# Patient Record
Sex: Male | Born: 1970
Health system: Southern US, Community
[De-identification: ages and names within clinical notes are randomized; demographics above are authoritative.]

## PROBLEM LIST (undated history)

## (undated) DIAGNOSIS — T7840XA Allergy, unspecified, initial encounter: Secondary | ICD-10-CM

## (undated) DIAGNOSIS — K219 Gastro-esophageal reflux disease without esophagitis: Secondary | ICD-10-CM

## (undated) DIAGNOSIS — I10 Essential (primary) hypertension: Secondary | ICD-10-CM

## (undated) HISTORY — DX: Gastro-esophageal reflux disease without esophagitis: K21.9

## (undated) HISTORY — DX: Allergy, unspecified, initial encounter: T78.40XA

## (undated) HISTORY — DX: Essential (primary) hypertension: I10

---

## 1999-12-25 ENCOUNTER — Emergency Department (HOSPITAL_COMMUNITY): Admission: EM | Admit: 1999-12-25 | Discharge: 1999-12-25 | Payer: Self-pay | Admitting: Emergency Medicine

## 2003-10-31 ENCOUNTER — Encounter: Admission: RE | Admit: 2003-10-31 | Discharge: 2003-10-31 | Payer: Self-pay | Admitting: Family Medicine

## 2006-02-26 ENCOUNTER — Emergency Department (HOSPITAL_COMMUNITY): Admission: EM | Admit: 2006-02-26 | Discharge: 2006-02-26 | Payer: Self-pay | Admitting: *Deleted

## 2008-06-08 ENCOUNTER — Ambulatory Visit (HOSPITAL_COMMUNITY): Admission: RE | Admit: 2008-06-08 | Discharge: 2008-06-08 | Payer: Self-pay | Admitting: *Deleted

## 2008-06-08 ENCOUNTER — Encounter (INDEPENDENT_AMBULATORY_CARE_PROVIDER_SITE_OTHER): Payer: Self-pay | Admitting: *Deleted

## 2008-09-07 ENCOUNTER — Encounter: Admission: RE | Admit: 2008-09-07 | Discharge: 2008-09-07 | Payer: Self-pay | Admitting: *Deleted

## 2011-01-13 NOTE — Op Note (Signed)
NAMEABDULMALIK, [name] NO.:  [id number]   MEDICAL RECORD NO.:  [id number]          PATIENT TYPE:  AMB   LOCATION:  ENDO                         FACILITY:  [facility]   PHYSICIAN:  Georgiana Spinner, M.D.    DATE OF BIRTH:  08/05/1971   DATE OF PROCEDURE:  DATE OF DISCHARGE:                               OPERATIVE REPORT   PROCEDURE:  Upper endoscopy with biopsy.   INDICATIONS:  Vomiting.   ANESTHESIA:  Fentanyl 80 mcg, Versed 8 mg.   PROCEDURE:  With the patient mildly sedated in the left lateral  decubitus position, the Pentax videoscopic endoscope was inserted in the  mouth, passed under direct vision through the esophagus, which appeared  normal until we reached distal esophagus.  There appeared to be a small  fleshy polyp that seen as were changes of probable Barrett esophagus,  and these were photographed.  We entered into the stomach, fundus, body,  antrum, duodenal bulb, second portion of the duodenum; all appeared  normal.  The patient continued to retch throughout this procedure.  It  appeared he was gagging himself on the scope despite the fact that he  was reasonably sedated.  We were able to advance it through the pylorus  into the duodenal bulb and second portion of the duodenum, both of which  appeared normal.  From this point the endoscope was slowly withdrawn,  taking circumferential views of the duodenal mucosa until the endoscope  had been pulled back into the stomach, placed in retroflexion to view  the stomach from below.  The endoscope was then straightened and  withdrawn, taking circumferential views of the remaining gastric and  esophageal mucosa, at which point we biopsied the areas that we felt  were Barrett's and also biopsied the fleshy polyp and all placed in the  same bottle labeled distal esophagus rule out Barrett's and evaluate  polyp.  The endoscope was withdrawn.  The patient's vital signs, pulse  oximeter remained stable.  The patient  tolerated the procedure well  without apparent complications.   FINDINGS:  The patient had a hypersensitive gag response and retched a  fair amount through the procedure but we saw a good view of the stomach,  duodenum, which appeared normal and also the esophagus, which showed  changes consistent with Barrett esophagus and a small fleshy polypoid  lesion, all of which were biopsied.   PLAN:  The patient would probably benefit by PPI therapy at this point  and will await biopsy report.  The patient will call me for results and  follow up with me as an outpatient.           ______________________________  Georgiana Spinner, M.D.     GMO/MEDQ  D:  06/08/2008  T:  06/09/2008  Job:  [id number]   cc:   C. Duane Lope, M.D.  Fax: [phone]

## 2011-12-04 ENCOUNTER — Ambulatory Visit: Payer: Self-pay | Admitting: Internal Medicine

## 2011-12-11 ENCOUNTER — Encounter: Payer: Self-pay | Admitting: Internal Medicine

## 2011-12-11 ENCOUNTER — Ambulatory Visit (INDEPENDENT_AMBULATORY_CARE_PROVIDER_SITE_OTHER): Payer: BC Managed Care – PPO | Admitting: Internal Medicine

## 2011-12-11 VITALS — BP 140/94 | HR 96 | Temp 98.6°F | Ht 66.75 in | Wt 172.0 lb

## 2011-12-11 DIAGNOSIS — K219 Gastro-esophageal reflux disease without esophagitis: Secondary | ICD-10-CM

## 2011-12-11 DIAGNOSIS — N433 Hydrocele, unspecified: Secondary | ICD-10-CM

## 2011-12-11 DIAGNOSIS — I10 Essential (primary) hypertension: Secondary | ICD-10-CM | POA: Insufficient documentation

## 2011-12-11 MED ORDER — LOSARTAN POTASSIUM 50 MG PO TABS: 50.0000 mg | ORAL_TABLET | Freq: Every day | ORAL | Status: DC

## 2011-12-11 MED ORDER — OMEPRAZOLE 40 MG PO CPDR: 40.0000 mg | DELAYED_RELEASE_CAPSULE | Freq: Every day | ORAL | Status: DC

## 2011-12-11 NOTE — Patient Instructions (Signed)
Please complete your blood test within 2 weeks

## 2011-12-11 NOTE — Assessment & Plan Note (Signed)
41 year old [place] American male with stage I hypertension. Start losartan 50 mg once daily. Dietary changes also recommended.

## 2011-12-11 NOTE — Assessment & Plan Note (Signed)
Enlarging right testicular mass presumed hydrocele. Repeat testicular ultrasound.

## 2011-12-11 NOTE — Progress Notes (Signed)
Subjective:    Patient ID: Marc Hancock, male    DOB: May 01, 1971, 41 y.o.   MRN: [id number]  HPI  A 41 year old African American male to establish. Patient has a history of mild hypertension x4 years. He has not been treated with any medication. His wife suggested taking magnesium, calcium and vitamin D supplement. His diet is fairly unhealthy.  Wife reports intake of processed foods. His home blood pressure readings are usually in the 140s systolic and 90-100 diastolic. He has no associated symptoms of high blood pressure. He denies history of coronary disease or history of stroke.  He also has a history of chronic GERD. He was seen by gastroenterologist approximately 3 years ago. EGD revealed moderate to severe esophagitis. He has been taking a proton pump inhibitor ever since. He denies dysphagia. He is not aware that H. pylori testing was completed.  He also has a history of right testicular abnormality. Scrotal ultrasound was performed in 2005. It showed small right hydrocele. Patient reports his right testicle intermittently gets larger. His hyrocele has definitely increased in size since previous ultrasound.   Review of Systems   Constitutional: Negative for activity change, appetite change and unexpected weight change.  Eyes: Negative for visual disturbance.  Respiratory: Negative for cough, chest tightness and shortness of breath.   Cardiovascular: Negative for chest pain.  Genitourinary: Negative for difficulty urinating.  Neurological: Negative for headaches.  Gastrointestinal: Negative for abdominal pain, heartburn melena or hematochezia Psych: Negative for depression or anxiety  Past Medical History  Diagnosis Date  . GERD (gastroesophageal reflux disease)   . Hypertension   . Allergy     History   Social History  . Marital Status: Divorced    Spouse Name: N/A    Number of Children: N/A  . Years of Education: N/A   Occupational History  . Not on file.   Social  History Main Topics  . Smoking status: Never Smoker   . Smokeless tobacco: Not on file  . Alcohol Use: Yes  . Drug Use: No  . Sexually Active: Not on file   Other Topics Concern  . Not on file   Social History Narrative  . No narrative on file    History reviewed. No pertinent past surgical history.  Family History  Problem Relation Age of Onset  . Alcohol abuse    . Arthritis    . Heart disease    . Stroke    . Depression    . Diabetes      No Known Allergies  Current Outpatient Prescriptions on File Prior to Visit  Medication Sig Dispense Refill  . calcium-vitamin D (OSCAL WITH D) 250-125 MG-UNIT per tablet Take 1 tablet by mouth daily.      Marland Kitchen losartan (COZAAR) 50 MG tablet Take 1 tablet (50 mg total) by mouth daily.  30 tablet  2  . omeprazole (PRILOSEC) 40 MG capsule Take 1 capsule (40 mg total) by mouth daily.  90 capsule  1    BP 140/94  Pulse 96  Temp(Src) 98.6 F (37 C) (Oral)  Ht 5' 6.75" (1.695 m)  Wt 172 lb (78.019 kg)  BMI 27.14 kg/m2        Objective:   Physical Exam  Constitutional: He is oriented to person, place, and time. He appears well-developed and well-nourished. No distress.  HENT:  Head: Normocephalic and atraumatic.  Right Ear: External ear normal.  Left Ear: External ear normal.  Mouth/Throat: Oropharynx is clear and moist.  Eyes: Conjunctivae are normal. Pupils are equal, round, and reactive to light. No scleral icterus.  Neck: Normal range of motion. Neck supple.  Cardiovascular: Normal rate, regular rhythm, normal heart sounds and intact distal pulses.   No murmur heard. Pulmonary/Chest: Effort normal and breath sounds normal. No respiratory distress. He has no wheezes. He has no rales.  Abdominal: Soft. Bowel sounds are normal. There is no tenderness. There is no rebound.  Genitourinary: Rectum normal, prostate normal and penis normal. Guaiac negative stool.       Firm right testicular mass, nontender  Musculoskeletal: He  exhibits no edema.  Lymphadenopathy:    He has cervical adenopathy.  Neurological: He is alert and oriented to person, place, and time. No cranial nerve deficit.  Skin: Skin is warm and dry.  Psychiatric: He has a normal mood and affect. His behavior is normal.       Assessment & Plan:

## 2011-12-11 NOTE — Assessment & Plan Note (Signed)
Patient has chronic GERD. Continue omeprazole 40 mg once daily for now. Obtain copy of previous EGD.

## 2011-12-16 ENCOUNTER — Other Ambulatory Visit: Payer: BC Managed Care – PPO

## 2012-06-11 ENCOUNTER — Other Ambulatory Visit: Payer: Self-pay | Admitting: Internal Medicine

## 2012-09-20 ENCOUNTER — Other Ambulatory Visit: Payer: Self-pay | Admitting: Internal Medicine

## 2012-10-14 ENCOUNTER — Ambulatory Visit: Payer: BC Managed Care – PPO | Admitting: Internal Medicine

## 2012-10-24 ENCOUNTER — Ambulatory Visit (INDEPENDENT_AMBULATORY_CARE_PROVIDER_SITE_OTHER): Payer: No Typology Code available for payment source | Admitting: Internal Medicine

## 2012-10-24 ENCOUNTER — Encounter: Payer: Self-pay | Admitting: Internal Medicine

## 2012-10-24 VITALS — BP 160/104 | Temp 97.9°F | Wt 169.0 lb

## 2012-10-24 DIAGNOSIS — N433 Hydrocele, unspecified: Secondary | ICD-10-CM

## 2012-10-24 DIAGNOSIS — I10 Essential (primary) hypertension: Secondary | ICD-10-CM

## 2012-10-24 DIAGNOSIS — K219 Gastro-esophageal reflux disease without esophagitis: Secondary | ICD-10-CM

## 2012-10-24 MED ORDER — LOSARTAN POTASSIUM 100 MG PO TABS: 100.0000 mg | ORAL_TABLET | Freq: Every day | ORAL | Status: DC

## 2012-10-24 MED ORDER — AMLODIPINE BESYLATE 2.5 MG PO TABS: 2.5000 mg | ORAL_TABLET | Freq: Every day | ORAL | Status: DC

## 2012-10-24 MED ORDER — DEXLANSOPRAZOLE 60 MG PO CPDR: 60.0000 mg | DELAYED_RELEASE_CAPSULE | Freq: Every day | ORAL | Status: DC

## 2012-10-24 NOTE — Assessment & Plan Note (Signed)
Blood pressure is poorly controlled despite taking 50 mg of losartan. Increase to 100 mg of losartan and amlodipine 2.5 mg once daily. Arrange BMET before next OV.

## 2012-10-24 NOTE — Assessment & Plan Note (Signed)
Patient having refractory reflux symptoms despite taking omeprazole 40 mg once daily. Switched to Dexilant 60 mg once daily

## 2012-10-24 NOTE — Progress Notes (Signed)
  Subjective:    Patient ID: Marc Hancock, male    DOB: 1971/02/23, 42 y.o.   MRN: [id number]  HPI  42 year old African American male for followup regarding hypertension and gastroesophageal reflux disease.  Despite taking omeprazole 40 mg once daily patient reports persistent reflux symptoms. He recalls he was on a "stronger medication" in the past. (Possibly Dexilant)  Hypertension-patient reports taking losartan 50 mg once daily. His blood pressure is uncontrolled.  History of right testicular abnormality-patient never completed followup scrotal ultrasound.  Review of Systems He reports bout of nausea and vomiting for 2 days on Fri and Sat.  GI symptoms resolved.  Past Medical History  Diagnosis Date  . GERD (gastroesophageal reflux disease)   . Hypertension   . Allergy     History   Social History  . Marital Status: Divorced    Spouse Name: N/A    Number of Children: N/A  . Years of Education: N/A   Occupational History  . Not on file.   Social History Main Topics  . Smoking status: Never Smoker   . Smokeless tobacco: Not on file  . Alcohol Use: Yes  . Drug Use: No  . Sexually Active: Not on file   Other Topics Concern  . Not on file   Social History Narrative  . No narrative on file    No past surgical history on file.  Family History  Problem Relation Age of Onset  . Alcohol abuse    . Arthritis    . Heart disease    . Stroke    . Depression    . Diabetes      No Known Allergies  Current Outpatient Prescriptions on File Prior to Visit  Medication Sig Dispense Refill  . aspirin 81 MG tablet Take 81 mg by mouth daily.      . calcium-vitamin D (OSCAL WITH D) 250-125 MG-UNIT per tablet Take 1 tablet by mouth daily.       No current facility-administered medications on file prior to visit.    BP 160/104  Temp(Src) 97.9 F (36.6 C) (Oral)  Wt 169 lb (76.658 kg)  BMI 26.68 kg/m2       Objective:   Physical Exam  Constitutional: He is  oriented to person, place, and time. He appears well-developed and well-nourished.  Cardiovascular: Normal rate, regular rhythm and normal heart sounds.   Pulmonary/Chest: Effort normal and breath sounds normal. He has no wheezes.  Abdominal: Soft. There is no tenderness.  Neurological: He is alert and oriented to person, place, and time.  Psychiatric: He has a normal mood and affect. His behavior is normal.          Assessment & Plan:

## 2012-10-24 NOTE — Assessment & Plan Note (Signed)
Patient never completed testicular ultrasound as directed at previous visit.. Proceed with testicular ultrasound

## 2012-10-24 NOTE — Patient Instructions (Signed)
Please complete the following lab tests before your next follow up appointment: BMET, FLP, LFTs, TSH, CBCD - 401.9

## 2012-10-28 ENCOUNTER — Ambulatory Visit
Admission: RE | Admit: 2012-10-28 | Discharge: 2012-10-28 | Disposition: A | Discharge status: Home / Self Care | Payer: No Typology Code available for payment source | Source: Ambulatory Visit | Attending: Internal Medicine | Admitting: Internal Medicine

## 2012-10-28 DIAGNOSIS — N433 Hydrocele, unspecified: Secondary | ICD-10-CM

## 2012-11-17 ENCOUNTER — Other Ambulatory Visit: Payer: No Typology Code available for payment source

## 2012-11-21 ENCOUNTER — Other Ambulatory Visit (INDEPENDENT_AMBULATORY_CARE_PROVIDER_SITE_OTHER): Payer: No Typology Code available for payment source

## 2012-11-21 ENCOUNTER — Ambulatory Visit: Payer: No Typology Code available for payment source | Admitting: Internal Medicine

## 2012-11-21 DIAGNOSIS — I10 Essential (primary) hypertension: Secondary | ICD-10-CM

## 2012-11-21 DIAGNOSIS — K219 Gastro-esophageal reflux disease without esophagitis: Secondary | ICD-10-CM

## 2012-11-22 ENCOUNTER — Ambulatory Visit (INDEPENDENT_AMBULATORY_CARE_PROVIDER_SITE_OTHER): Payer: No Typology Code available for payment source | Admitting: Internal Medicine

## 2012-11-22 ENCOUNTER — Encounter: Payer: Self-pay | Admitting: Internal Medicine

## 2012-11-22 VITALS — BP 158/110 | HR 64 | Temp 98.5°F | Wt 171.0 lb

## 2012-11-22 DIAGNOSIS — N433 Hydrocele, unspecified: Secondary | ICD-10-CM

## 2012-11-22 DIAGNOSIS — I10 Essential (primary) hypertension: Secondary | ICD-10-CM

## 2012-11-22 DIAGNOSIS — M549 Dorsalgia, unspecified: Secondary | ICD-10-CM

## 2012-11-22 LAB — BASIC METABOLIC PANEL
BUN: 13 mg/dL (ref 6–23)
CO2: 28 mEq/L (ref 19–32)
Calcium: 9.5 mg/dL (ref 8.4–10.5)
Chloride: 104 mEq/L (ref 96–112)
Creatinine, Ser: 1.1 mg/dL (ref 0.4–1.5)
GFR: 91.81 mL/min (ref >60.00)
Glucose, Bld: 92 mg/dL (ref 70–99)
Potassium: 4.1 mEq/L (ref 3.5–5.1)
Sodium: 139 mEq/L (ref 135–145)

## 2012-11-22 LAB — CBC WITH DIFFERENTIAL/PLATELET
Basophils Absolute: 0.1 10*3/uL (ref 0.0–0.1)
Basophils Relative: 0.7 % (ref 0.0–3.0)
Eosinophils Absolute: 0.1 10*3/uL (ref 0.0–0.7)
Eosinophils Relative: 0.7 % (ref 0.0–5.0)
HCT: 39.1 % (ref 39.0–52.0)
Hemoglobin: 12.8 g/dL — ABNORMAL LOW (ref 13.0–17.0)
Lymphocytes Relative: 39 % (ref 12.0–46.0)
Lymphs Abs: 3.5 10*3/uL (ref 0.7–4.0)
MCHC: 32.8 g/dL (ref 30.0–36.0)
MCV: 90.9 fl (ref 78.0–100.0)
Monocytes Absolute: 0.6 10*3/uL (ref 0.1–1.0)
Monocytes Relative: 6.8 % (ref 3.0–12.0)
Neutro Abs: 4.7 10*3/uL (ref 1.4–7.7)
Neutrophils Relative %: 52.8 % (ref 43.0–77.0)
Platelets: 186 10*3/uL (ref 150.0–400.0)
RBC: 4.31 Mil/uL (ref 4.22–5.81)
RDW: 13.3 % (ref 11.5–14.6)
WBC: 8.9 10*3/uL (ref 4.5–10.5)

## 2012-11-22 LAB — HEPATIC FUNCTION PANEL
ALT: 22 U/L (ref 0–53)
AST: 24 U/L (ref 0–37)
Albumin: 4.2 g/dL (ref 3.5–5.2)
Alkaline Phosphatase: 84 U/L (ref 39–117)
Bilirubin, Direct: 0.2 mg/dL (ref 0.0–0.3)
Total Bilirubin: 1.3 mg/dL — ABNORMAL HIGH (ref 0.3–1.2)
Total Protein: 7.5 g/dL (ref 6.0–8.3)

## 2012-11-22 LAB — TSH: TSH: 0.48 u[IU]/mL (ref 0.35–5.50)

## 2012-11-22 LAB — LIPID PANEL
Cholesterol: 173 mg/dL (ref 0–200)
HDL: 45.7 mg/dL (ref >39.00)
LDL Cholesterol: 114 mg/dL — ABNORMAL HIGH (ref 0–99)
Total CHOL/HDL Ratio: 4
Triglycerides: 67 mg/dL (ref 0.0–149.0)
VLDL: 13.4 mg/dL (ref 0.0–40.0)

## 2012-11-22 MED ORDER — AMLODIPINE BESYLATE 5 MG PO TABS: 2.5000 mg | ORAL_TABLET | Freq: Every day | ORAL | Status: DC

## 2012-11-22 MED ORDER — LOSARTAN POTASSIUM 100 MG PO TABS: 100.0000 mg | ORAL_TABLET | Freq: Every day | ORAL | Status: DC

## 2012-11-22 MED ORDER — AMLODIPINE BESYLATE 5 MG PO TABS: 5.0000 mg | ORAL_TABLET | Freq: Every day | ORAL | Status: DC

## 2012-11-22 NOTE — Assessment & Plan Note (Signed)
10/14/2012 - Testicular ultrasound showed: 1. Large simple appearing right hydrocele. Normal right testicle.  2. Tiny 2 mm hyperechoic area in the left testis, favor dystrophic calcification.  Patient reports right hydrocele is bothersome. Refer to urology for surgery.

## 2012-11-22 NOTE — Progress Notes (Signed)
  Subjective:    Patient ID: Marc Hancock, male    DOB: [birth year], 42 y.o.   MRN: [id number]  HPI  42 year old African American male previously seen for hypertension, reflux and right testicular abnormality for followup. Patient completed scrotal ultrasound. It showed right hydrocele. Patient reports right hydrocele gets larger at times and is bothersome. He is interested in surgical removal.  At previous visit patient started on amlodipine in addition to losartan. Patient's blood pressure still uncontrolled.  Patient also complains of left-sided back pain. Symptoms worse in mid thoracic spine. No radiation of pain. He denies any upper extremity weakness.   Review of Systems Negative for chest pain, no arm or leg weakness  Past Medical History  Diagnosis Date  . GERD (gastroesophageal reflux disease)   . Hypertension   . Allergy     History   Social History  . Marital Status: Divorced    Spouse Name: N/A    Number of Children: N/A  . Years of Education: N/A   Occupational History  . Not on file.   Social History Main Topics  . Smoking status: Never Smoker   . Smokeless tobacco: Not on file  . Alcohol Use: Yes  . Drug Use: No  . Sexually Active: Not on file   Other Topics Concern  . Not on file   Social History Narrative  . No narrative on file    No past surgical history on file.  Family History  Problem Relation Age of Onset  . Alcohol abuse    . Arthritis    . Heart disease    . Stroke    . Depression    . Diabetes      No Known Allergies  Current Outpatient Prescriptions on File Prior to Visit  Medication Sig Dispense Refill  . aspirin 81 MG tablet Take 81 mg by mouth daily.       No current facility-administered medications on file prior to visit.    BP 158/110  Pulse 64  Temp(Src) 98.5 F (36.9 C) (Oral)  Wt 171 lb (77.565 kg)  BMI 27 kg/m2       Objective:   Physical Exam  Constitutional: He appears well-developed and  well-nourished.  HENT:  Head: Normocephalic and atraumatic.  Cardiovascular: Normal rate, regular rhythm and normal heart sounds.   Pulmonary/Chest: Effort normal and breath sounds normal. He has no wheezes.  Musculoskeletal: He exhibits no edema.  T7-9 rotated left C3-5 rotated left  Neurological: He is alert. He displays normal reflexes. No cranial nerve deficit. He exhibits normal muscle tone.  Muscle strength 5 out of 5 upper and lower extremities  Skin: Skin is warm and dry.  Psychiatric: He has a normal mood and affect. His behavior is normal.          Assessment & Plan:

## 2012-11-22 NOTE — Assessment & Plan Note (Signed)
Blood pressure is still suboptimally controlled. Increase amlodipine to 5 mg. Continue losartan 100 mg. Encouraged low salt diet.  BP: 158/110 mmHg  Lab Results  Component Value Date   CREATININE 1.1 11/21/2012

## 2012-11-22 NOTE — Assessment & Plan Note (Signed)
Patient complains of intermittent back pain especially of his thoracic spine. Utilizing myofascial techniques and HVLA to thoracic and cervical spine. Patient tolerated well without complications. Also provided contact information for  massage therapy.

## 2012-11-22 NOTE — Patient Instructions (Addendum)
Our office will contact you re: Urology referral Please follow low salt diet (2.5 grams per day) [email or web address]

## 2012-11-25 ENCOUNTER — Ambulatory Visit: Payer: Self-pay | Admitting: Internal Medicine

## 2012-12-17 ENCOUNTER — Other Ambulatory Visit: Payer: Self-pay | Admitting: Internal Medicine

## 2012-12-20 ENCOUNTER — Other Ambulatory Visit: Payer: Self-pay | Admitting: Internal Medicine

## 2012-12-27 ENCOUNTER — Ambulatory Visit (INDEPENDENT_AMBULATORY_CARE_PROVIDER_SITE_OTHER): Payer: Self-pay | Admitting: General Surgery

## 2013-01-12 ENCOUNTER — Encounter (INDEPENDENT_AMBULATORY_CARE_PROVIDER_SITE_OTHER): Payer: Self-pay | Admitting: General Surgery

## 2013-01-24 ENCOUNTER — Other Ambulatory Visit: Payer: Self-pay | Admitting: Internal Medicine

## 2013-03-01 ENCOUNTER — Encounter (INDEPENDENT_AMBULATORY_CARE_PROVIDER_SITE_OTHER): Payer: Self-pay | Admitting: General Surgery

## 2013-03-01 ENCOUNTER — Ambulatory Visit (INDEPENDENT_AMBULATORY_CARE_PROVIDER_SITE_OTHER): Payer: No Typology Code available for payment source | Admitting: General Surgery

## 2013-03-01 VITALS — BP 148/90 | HR 88 | Resp 16 | Ht 66.5 in | Wt 170.2 lb

## 2013-03-01 DIAGNOSIS — N433 Hydrocele, unspecified: Secondary | ICD-10-CM

## 2013-03-01 DIAGNOSIS — K4090 Unilateral inguinal hernia, without obstruction or gangrene, not specified as recurrent: Secondary | ICD-10-CM

## 2013-03-01 NOTE — Patient Instructions (Signed)
You have a right inguinal hernia, and that is probably causing your right groin pain. You also have a hydrocele and have requested a vasectomy.  Your back pain and your right leg pain are not due to your hernia. You may need to see an orthopedic surgeon to discuss that.  You will be scheduled for repair of a right inguinal hernia with mesh. We will coordinate this with Dr. Brunilda Payor so that Dr. Brunilda Payor can perform a vasectomy and a hydrocelectomy at the same time.      Inguinal Hernia, Adult Muscles help keep everything in the body in its proper place. But if a weak spot in the muscles develops, something can poke through. That is called a hernia. When this happens in the lower part of the belly (abdomen), it is called an inguinal hernia. (It takes its name from a part of the body in this region called the inguinal canal.) A weak spot in the wall of muscles lets some fat or part of the small intestine bulge through. An inguinal hernia can develop at any age. Men get them more often than women. CAUSES  In adults, an inguinal hernia develops over time.  It can be triggered by:  Suddenly straining the muscles of the lower abdomen.  Lifting heavy objects.  Straining to have a bowel movement. Difficult bowel movements (constipation) can lead to this.  Constant coughing. This may be caused by smoking or lung disease.  Being overweight.  Being pregnant.  Working at a job that requires long periods of standing or heavy lifting.  Having had an inguinal hernia before. One type can be an emergency situation. It is called a strangulated inguinal hernia. It develops if part of the small intestine slips through the weak spot and cannot get back into the abdomen. The blood supply can be cut off. If that happens, part of the intestine may die. This situation requires emergency surgery. SYMPTOMS  Often, a small inguinal hernia has no symptoms. It is found when a healthcare provider does a physical exam.  Larger hernias usually have symptoms.   In adults, symptoms may include:  A lump in the groin. This is easier to see when the person is standing. It might disappear when lying down.  In men, a lump in the scrotum.  Pain or burning in the groin. This occurs especially when lifting, straining or coughing.  A dull ache or feeling of pressure in the groin.  Signs of a strangulated hernia can include:  A bulge in the groin that becomes very painful and tender to the touch.  A bulge that turns red or purple.  Fever, nausea and vomiting.  Inability to have a bowel movement or to pass gas. DIAGNOSIS  To decide if you have an inguinal hernia, a healthcare provider will probably do a physical examination.  This will include asking questions about any symptoms you have noticed.  The healthcare provider might feel the groin area and ask you to cough. If an inguinal hernia is felt, the healthcare provider may try to slide it back into the abdomen.  Usually no other tests are needed. TREATMENT  Treatments can vary. The size of the hernia makes a difference. Options include:  Watchful waiting. This is often suggested if the hernia is small and you have had no symptoms.  No medical procedure will be done unless symptoms develop.  You will need to watch closely for symptoms. If any occur, contact your healthcare provider right away.  Surgery.  This is used if the hernia is larger or you have symptoms.  Open surgery. This is usually an outpatient procedure (you will not stay overnight in a hospital). An cut (incision) is made through the skin in the groin. The hernia is put back inside the abdomen. The weak area in the muscles is then repaired by herniorrhaphy or hernioplasty. Herniorrhaphy: in this type of surgery, the weak muscles are sewn back together. Hernioplasty: a patch or mesh is used to close the weak area in the abdominal wall.  Laparoscopy. In this procedure, a surgeon makes small  incisions. A thin tube with a tiny video camera (called a laparoscope) is put into the abdomen. The surgeon repairs the hernia with mesh by looking with the video camera and using two long instruments. HOME CARE INSTRUCTIONS   After surgery to repair an inguinal hernia:  You will need to take pain medicine prescribed by your healthcare provider. Follow all directions carefully.  You will need to take care of the wound from the incision.  Your activity will be restricted for awhile. This will probably include no heavy lifting for several weeks. You also should not do anything too active for a few weeks. When you can return to work will depend on the type of job that you have.  During "watchful waiting" periods, you should:  Maintain a healthy weight.  Eat a diet high in fiber (fruits, vegetables and whole grains).  Drink plenty of fluids to avoid constipation. This means drinking enough water and other liquids to keep your urine clear or pale yellow.  Do not lift heavy objects.  Do not stand for long periods of time.  Quit smoking. This should keep you from developing a frequent cough. SEEK MEDICAL CARE IF:   A bulge develops in your groin area.  You feel pain, a burning sensation or pressure in the groin. This might be worse if you are lifting or straining.  You develop a fever of more than 100.5 F (38.1 C). SEEK IMMEDIATE MEDICAL CARE IF:   Pain in the groin increases suddenly.  A bulge in the groin gets bigger suddenly and does not go down.  For men, there is sudden pain in the scrotum. Or, the size of the scrotum increases.  A bulge in the groin area becomes red or purple and is painful to touch.  You have nausea or vomiting that does not go away.  You feel your heart beating much faster than normal.  You cannot have a bowel movement or pass gas.  You develop a fever of more than 102.0 F (38.9 C). Document Released: 01/03/2009 Document Revised: 11/09/2011  Document Reviewed: 01/03/2009 [facility] Patient Information 2014 [place], [place].

## 2013-03-01 NOTE — Progress Notes (Signed)
Patient ID: Marc Hancock, male   DOB: February 12, 1971, 42 y.o.   MRN: [id number]  No chief complaint on file.   HPI Marc Hancock is a 42 y.o. male.  He was referred by Dr. Su Grand for evaluation of a right inguinal hernia. His primary care physician is Dr. Artist Pais.  The patient states that he's had a swelling in his right groin and scrotum for about 5 years. He says he gets larger and smaller but never goes away. Lately he's been having some pain. He also reports some back pain and right leg pain, he does a lot of heavy lifting at work. An ultrasound was done which shows a large hydrocele on the right side.  He saw Dr. Brunilda Payor.  He requested a vasectomy and management of the swelling. Dr. Brunilda Payor would like to perform a vasectomy and repair his hydrocele but wanted me to evaluate the patient for a hernia.  The patient is generally healthy. He has hypertension and GERD. No prior hernia problems or surgery. HPI  Past Medical History  Diagnosis Date  . GERD (gastroesophageal reflux disease)   . Hypertension   . Allergy     History reviewed. No pertinent past surgical history.  Family History  Problem Relation Age of Onset  . Alcohol abuse    . Arthritis    . Heart disease    . Stroke    . Depression    . Diabetes      Social History History  Substance Use Topics  . Smoking status: Never Smoker   . Smokeless tobacco: Not on file  . Alcohol Use: Yes    No Known Allergies  Current Outpatient Prescriptions  Medication Sig Dispense Refill  . amLODipine (NORVASC) 5 MG tablet Take 1 tablet (5 mg total) by mouth daily.  90 tablet  1  . aspirin 81 MG tablet Take 81 mg by mouth daily.      Marland Kitchen losartan (COZAAR) 100 MG tablet TAKE 1 TABLET (100 MG TOTAL) BY MOUTH DAILY.  30 tablet  2  . omeprazole (PRILOSEC) 40 MG capsule TAKE 1 CAPSULE (40 MG TOTAL) BY MOUTH DAILY.  90 capsule  1   No current facility-administered medications for this visit.    Review of Systems Review of Systems   Constitutional: Negative for fever, chills and unexpected weight change.  HENT: Negative for hearing loss, congestion, sore throat, trouble swallowing and voice change.   Eyes: Negative for visual disturbance.  Respiratory: Negative for cough and wheezing.   Cardiovascular: Negative for chest pain, palpitations and leg swelling.  Gastrointestinal: Negative for nausea, vomiting, abdominal pain, diarrhea, constipation, blood in stool, abdominal distention, anal bleeding and rectal pain.  Genitourinary: Positive for scrotal swelling and testicular pain. Negative for hematuria and difficulty urinating.  Musculoskeletal: Positive for back pain. Negative for arthralgias.  Skin: Negative for rash and wound.  Neurological: Negative for seizures, syncope, weakness and headaches.  Hematological: Negative for adenopathy. Does not bruise/bleed easily.  Psychiatric/Behavioral: Negative for confusion.    Blood pressure 148/90, pulse 88, resp. rate 16, height 5' 6.5" (1.689 m), weight 170 lb 3.2 oz (77.202 kg).  Physical Exam Physical Exam  Constitutional: He is oriented to person, place, and time. He appears well-developed and well-nourished. No distress.  HENT:  Head: Normocephalic.  Nose: Nose normal.  Mouth/Throat: No oropharyngeal exudate.  Eyes: Conjunctivae and EOM are normal. Pupils are equal, round, and reactive to light. Right eye exhibits no discharge. Left eye exhibits no discharge. No scleral  icterus.  Neck: Normal range of motion. Neck supple. No JVD present. No tracheal deviation present. No thyromegaly present.  Cardiovascular: Normal rate, regular rhythm, normal heart sounds and intact distal pulses.   No murmur heard. Pulmonary/Chest: Effort normal and breath sounds normal. No stridor. No respiratory distress. He has no wheezes. He has no rales. He exhibits no tenderness.  Abdominal: Soft. Bowel sounds are normal. He exhibits no distension and no mass. There is no tenderness. There  is no rebound and no guarding.  Genitourinary:  The patient has a right inguinal hernia. This is reducible. It feels like an indirect hernia. He also has a right hydrocele that does not reduce and does not seem to communicate with the hernia. Testes feel normal. No evidence of hernia on the left.  Musculoskeletal: Normal range of motion. He exhibits no edema and no tenderness.  Lymphadenopathy:    He has no cervical adenopathy.  Neurological: He is alert and oriented to person, place, and time. He has normal reflexes. Coordination normal.  Skin: Skin is warm and dry. No rash noted. He is not diaphoretic. No erythema. No pallor.  Psychiatric: He has a normal mood and affect. His behavior is normal. Judgment and thought content normal.    Data Reviewed Office notes from [facility]  Assessment    Reducible right inguinal hernia, probable indirect type  Right hydrocele  Desires vasectomy  Hypertension  GERD  Back pain and right leg pain, suspect orthopedic or musculoskeletal etiology.    Plan    The patient would like his hernia repaired and have the vasectomy and hydrocelectomy at the same time. That is reasonable.  We will schedule him for open repair of right inguinal hernia with mesh. We will coordinate with Dr. Brunilda Payor to perform the vasectomy and hydrocelectomy at the same time.  This can all be done as an outpatient  I discussed the indications, details, techniques, and numerous risk of the surgery with him. He is aware of the risk of bleeding, infection, recurrence of the hernia, injury to adjacent organs such as the intestine, bladder, or testicle, nerve damage with chronic pain, and other unforeseen problems. He understands all of these issues and all his questions were answered. He agrees with this plan.        Angelia Mould. Derrell Lolling, M.D., [facility] General and Minimally invasive Surgery Breast and Colorectal Surgery Office:    [phone] Pager:   [phone]  03/01/2013, 4:47 PM

## 2013-04-03 ENCOUNTER — Encounter (HOSPITAL_COMMUNITY): Payer: Self-pay | Admitting: Pharmacy Technician

## 2013-04-04 ENCOUNTER — Other Ambulatory Visit: Payer: Self-pay | Admitting: Urology

## 2013-04-04 NOTE — Patient Instructions (Signed)
Marc Hancock  04/04/2013   Your procedure is scheduled on:  04/11/13              Surgery 1000-1230pm  Report to [facility] at    0730  AM.  Call this number if you have problems the morning of surgery: [phone]   Remember:   Do not eat food or drink liquids after midnight.   Take these medicines the morning of surgery with A SIP OF WATER:    Do not wear jewelry,   Do not wear lotions, powders, or perfumes.   . Men may shave face and neck.  Do not bring valuables to the hospital.  Contacts, dentures or bridgework may not be worn into surgery.  .   Patients discharged the day of surgery will not be allowed to drive  home.  Name and phone number of your driver:    SEE [facility] INSTRUCTION SHEET    Please read over the following fact sheets that you were given: , coughing and deep breathing exercises, leg exercises               Failure to comply with these instructions may result in cancellation of your surgery.                Patient Signature ____________________________              Nurse Signature _____________________________

## 2013-04-04 NOTE — Progress Notes (Signed)
Left Yates Decamp a message regarding no orders in EPIC for Dr Brunilda Payor part of surgery scheduled for 04/11/13.

## 2013-04-05 ENCOUNTER — Ambulatory Visit (HOSPITAL_COMMUNITY)
Admission: RE | Admit: 2013-04-05 | Discharge: 2013-04-05 | Disposition: A | Discharge status: Home / Self Care | Payer: No Typology Code available for payment source | Source: Ambulatory Visit | Attending: General Surgery | Admitting: General Surgery

## 2013-04-05 ENCOUNTER — Encounter (HOSPITAL_COMMUNITY)
Admission: RE | Admit: 2013-04-05 | Discharge: 2013-04-05 | Disposition: A | Discharge status: Home / Self Care | Payer: No Typology Code available for payment source | Source: Ambulatory Visit | Attending: General Surgery | Admitting: General Surgery

## 2013-04-05 ENCOUNTER — Encounter (HOSPITAL_COMMUNITY): Payer: Self-pay

## 2013-04-05 DIAGNOSIS — Z01818 Encounter for other preprocedural examination: Secondary | ICD-10-CM | POA: Insufficient documentation

## 2013-04-05 DIAGNOSIS — Z01812 Encounter for preprocedural laboratory examination: Secondary | ICD-10-CM | POA: Insufficient documentation

## 2013-04-05 DIAGNOSIS — Z01810 Encounter for preprocedural cardiovascular examination: Secondary | ICD-10-CM | POA: Insufficient documentation

## 2013-04-05 DIAGNOSIS — K4090 Unilateral inguinal hernia, without obstruction or gangrene, not specified as recurrent: Secondary | ICD-10-CM | POA: Insufficient documentation

## 2013-04-05 LAB — COMPREHENSIVE METABOLIC PANEL
ALT: 16 U/L (ref 0–53)
AST: 21 U/L (ref 0–37)
Albumin: 4.1 g/dL (ref 3.5–5.2)
Alkaline Phosphatase: 95 U/L (ref 39–117)
BUN: 16 mg/dL (ref 6–23)
CO2: 30 mEq/L (ref 19–32)
Calcium: 9.6 mg/dL (ref 8.4–10.5)
Chloride: 102 mEq/L (ref 96–112)
Creatinine, Ser: 1.19 mg/dL (ref 0.50–1.35)
GFR calc Af Amer: 85 mL/min — ABNORMAL LOW (ref >90)
GFR calc non Af Amer: 73 mL/min — ABNORMAL LOW (ref >90)
Glucose, Bld: 92 mg/dL (ref 70–99)
Potassium: 4.3 mEq/L (ref 3.5–5.1)
Sodium: 138 mEq/L (ref 135–145)
Total Bilirubin: 0.5 mg/dL (ref 0.3–1.2)
Total Protein: 7.2 g/dL (ref 6.0–8.3)

## 2013-04-05 LAB — CBC
HCT: 35.8 % — ABNORMAL LOW (ref 39.0–52.0)
Hemoglobin: 12.2 g/dL — ABNORMAL LOW (ref 13.0–17.0)
MCH: 30.5 pg (ref 26.0–34.0)
MCHC: 34.1 g/dL (ref 30.0–36.0)
MCV: 89.5 fL (ref 78.0–100.0)
Platelets: 200 10*3/uL (ref 150–400)
RBC: 4 MIL/uL — ABNORMAL LOW (ref 4.22–5.81)
RDW: 12.8 % (ref 11.5–15.5)
WBC: 8 10*3/uL (ref 4.0–10.5)

## 2013-04-07 NOTE — Progress Notes (Signed)
No orders in [facility] for surgery on 04/11/13.  Patient seen on preop appointment on 03/05/13.  Need orders in EPIC.  Surgery time at 1000am.  Thank You.

## 2013-04-09 NOTE — H&P (Signed)
[name]   MRN:  [id number]   Description: 42 year old male  Provider: Ernestene Mention, MD  Department: [facility]        Diagnoses    Right inguinal hernia    -  Primary    550.90    Right hydrocele        603.9         Current Vitals     BP Pulse Resp Ht Wt BMI    148/90 88 16 5' 6.5" (1.689 m) 170 lb 3.2 oz (77.202 kg) 27.06 kg/m2       History and Physical   Ernestene Mention, MD     Status: Signed                          HPI Marc Hancock is a 42 y.o. male.  He was referred by Dr. Su Grand for evaluation of a right inguinal hernia. His primary care physician is Dr. Artist Pais.   The patient states that he's had a swelling in his right groin and scrotum for about 5 years. He says he gets larger and smaller but never goes away. Lately he's been having some pain. He also reports some back pain and right leg pain, he does a lot of heavy lifting at work. An ultrasound was done which shows a large hydrocele on the right side.   He saw Dr. Brunilda Payor.  He requested a vasectomy and management of the swelling. Dr. Brunilda Payor would like to perform a vasectomy and repair his hydrocele but wanted me to evaluate the patient for a hernia.   The patient is generally healthy. He has hypertension and GERD. No prior hernia problems or surgery.       Past Medical History   Diagnosis  Date   .  GERD (gastroesophageal reflux disease)     .  Hypertension     .  Allergy          History reviewed. No pertinent past surgical history.    Family History   Problem  Relation  Age of Onset   .  Alcohol abuse       .  Arthritis       .  Heart disease       .  Stroke       .  Depression       .  Diabetes            Social History History   Substance Use Topics   .  Smoking status:  Never Smoker    .  Smokeless tobacco:  Not on file   .  Alcohol Use:  Yes        No Known Allergies    Current Outpatient Prescriptions   Medication  Sig  Dispense  Refill   .   amLODipine (NORVASC) 5 MG tablet  Take 1 tablet (5 mg total) by mouth daily.   90 tablet   1   .  aspirin 81 MG tablet  Take 81 mg by mouth daily.         Marland Kitchen  losartan (COZAAR) 100 MG tablet  TAKE 1 TABLET (100 MG TOTAL) BY MOUTH DAILY.   30 tablet   2   .  omeprazole (PRILOSEC) 40 MG capsule  TAKE 1 CAPSULE (40 MG TOTAL) BY MOUTH DAILY.   90 capsule   1       Review of  Systems   Constitutional: Negative for fever, chills and unexpected weight change.  HENT: Negative for hearing loss, congestion, sore throat, trouble swallowing and voice change.   Eyes: Negative for visual disturbance.  Respiratory: Negative for cough and wheezing.   Cardiovascular: Negative for chest pain, palpitations and leg swelling.  Gastrointestinal: Negative for nausea, vomiting, abdominal pain, diarrhea, constipation, blood in stool, abdominal distention, anal bleeding and rectal pain.  Genitourinary: Positive for scrotal swelling and testicular pain. Negative for hematuria and difficulty urinating.  Musculoskeletal: Positive for back pain. Negative for arthralgias.  Skin: Negative for rash and wound.  Neurological: Negative for seizures, syncope, weakness and headaches.  Hematological: Negative for adenopathy. Does not bruise/bleed easily.  Psychiatric/Behavioral: Negative for confusion.      Blood pressure 148/90, pulse 88, resp. rate 16, height 5' 6.5" (1.689 m), weight 170 lb 3.2 oz (77.202 kg).   Physical Exam  Constitutional: He is oriented to person, place, and time. He appears well-developed and well-nourished. No distress.  HENT:   Head: Normocephalic.   Nose: Nose normal.   Mouth/Throat: No oropharyngeal exudate.  Eyes: Conjunctivae and EOM are normal. Pupils are equal, round, and reactive to light. Right eye exhibits no discharge. Left eye exhibits no discharge. No scleral icterus.  Neck: Normal range of motion. Neck supple. No JVD present. No tracheal deviation present. No thyromegaly present.   Cardiovascular: Normal rate, regular rhythm, normal heart sounds and intact distal pulses.    No murmur heard. Pulmonary/Chest: Effort normal and breath sounds normal. No stridor. No respiratory distress. He has no wheezes. He has no rales. He exhibits no tenderness.  Abdominal: Soft. Bowel sounds are normal. He exhibits no distension and no mass. There is no tenderness. There is no rebound and no guarding.  Genitourinary:  The patient has a right inguinal hernia. This is reducible. It feels like an indirect hernia. He also has a right hydrocele that does not reduce and does not seem to communicate with the hernia. Testes feel normal. No evidence of hernia on the left.  Musculoskeletal: Normal range of motion. He exhibits no edema and no tenderness.  Lymphadenopathy:    He has no cervical adenopathy.  Neurological: He is alert and oriented to person, place, and time. He has normal reflexes. Coordination normal.  Skin: Skin is warm and dry. No rash noted. He is not diaphoretic. No erythema. No pallor.  Psychiatric: He has a normal mood and affect. His behavior is normal. Judgment and thought content normal.      Data Reviewed Office notes from [facility]   Assessment    Reducible right inguinal hernia, probable indirect type   Right hydrocele   Desires vasectomy   Hypertension   GERD   Back pain and right leg pain, suspect orthopedic or musculoskeletal etiology.     Plan    The patient would like his hernia repaired and have the vasectomy and hydrocelectomy at the same time. That is reasonable.   We will schedule him for open repair of right inguinal hernia with mesh. We will coordinate with Dr. Brunilda Payor to perform the vasectomy and hydrocelectomy at the same time.  This can all be done as an outpatient   I discussed the indications, details, techniques, and numerous risk of the surgery with him. He is aware of the risk of bleeding, infection, recurrence of the  hernia, injury to adjacent organs such as the intestine, bladder, or testicle, nerve damage with chronic pain, and other unforeseen problems. He understands  all of these issues and all his questions were answered. He agrees with this plan.           Angelia Mould. Derrell Lolling, M.D., [facility] General and Minimally invasive Surgery Breast and Colorectal Surgery Office:   [phone] Pager:   [phone]

## 2013-04-11 ENCOUNTER — Encounter (HOSPITAL_COMMUNITY): Payer: Self-pay | Admitting: Anesthesiology

## 2013-04-11 ENCOUNTER — Encounter (HOSPITAL_COMMUNITY)
Admission: RE | Disposition: A | Discharge status: Home / Self Care | Payer: Self-pay | Source: Ambulatory Visit | Attending: General Surgery

## 2013-04-11 ENCOUNTER — Ambulatory Visit (HOSPITAL_COMMUNITY): Payer: No Typology Code available for payment source | Admitting: Anesthesiology

## 2013-04-11 ENCOUNTER — Encounter (HOSPITAL_COMMUNITY): Payer: Self-pay | Admitting: *Deleted

## 2013-04-11 ENCOUNTER — Ambulatory Visit (HOSPITAL_COMMUNITY)
Admission: RE | Admit: 2013-04-11 | Discharge: 2013-04-11 | Disposition: A | Discharge status: Home / Self Care | Payer: No Typology Code available for payment source | Source: Ambulatory Visit | Attending: General Surgery | Admitting: General Surgery

## 2013-04-11 DIAGNOSIS — I10 Essential (primary) hypertension: Secondary | ICD-10-CM | POA: Insufficient documentation

## 2013-04-11 DIAGNOSIS — Z7982 Long term (current) use of aspirin: Secondary | ICD-10-CM | POA: Insufficient documentation

## 2013-04-11 DIAGNOSIS — K4090 Unilateral inguinal hernia, without obstruction or gangrene, not specified as recurrent: Secondary | ICD-10-CM | POA: Diagnosis present

## 2013-04-11 DIAGNOSIS — Z302 Encounter for sterilization: Secondary | ICD-10-CM | POA: Insufficient documentation

## 2013-04-11 DIAGNOSIS — Z79899 Other long term (current) drug therapy: Secondary | ICD-10-CM | POA: Insufficient documentation

## 2013-04-11 DIAGNOSIS — N433 Hydrocele, unspecified: Secondary | ICD-10-CM | POA: Diagnosis present

## 2013-04-11 DIAGNOSIS — K219 Gastro-esophageal reflux disease without esophagitis: Secondary | ICD-10-CM | POA: Insufficient documentation

## 2013-04-11 HISTORY — PX: VASECTOMY: SHX75

## 2013-04-11 HISTORY — PX: INGUINAL HERNIA REPAIR: SHX194

## 2013-04-11 HISTORY — PX: HYDROCELE EXCISION: SHX482

## 2013-04-11 SURGERY — REPAIR, HERNIA, INGUINAL, ADULT
Anesthesia: General | Site: Scrotum | Laterality: Right | Wound class: Clean

## 2013-04-11 MED ORDER — SODIUM CHLORIDE 0.9 % IJ SOLN: 3.0000 mL | Freq: Two times a day (BID) | INTRAMUSCULAR | Status: DC

## 2013-04-11 MED ORDER — LIDOCAINE HCL (CARDIAC) 20 MG/ML IV SOLN
INTRAVENOUS | Status: DC | PRN
  Administered 2013-04-11: 100 mg via INTRAVENOUS

## 2013-04-11 MED ORDER — PROMETHAZINE HCL 25 MG/ML IJ SOLN: 6.2500 mg | INTRAMUSCULAR | Status: DC | PRN

## 2013-04-11 MED ORDER — 0.9 % SODIUM CHLORIDE (POUR BTL) OPTIME
TOPICAL | Status: DC | PRN
  Administered 2013-04-11: 1000 mL

## 2013-04-11 MED ORDER — ONDANSETRON HCL 4 MG/2ML IJ SOLN
INTRAMUSCULAR | Status: DC | PRN
  Administered 2013-04-11: 4 mg via INTRAVENOUS

## 2013-04-11 MED ORDER — ACETAMINOPHEN 650 MG RE SUPP
650.0000 mg | RECTAL | Status: DC | PRN
  Filled 2013-04-11: qty 1

## 2013-04-11 MED ORDER — LACTATED RINGERS IV SOLN: INTRAVENOUS | Status: DC

## 2013-04-11 MED ORDER — PROMETHAZINE HCL 25 MG/ML IJ SOLN
6.2500 mg | INTRAMUSCULAR | Status: DC | PRN
  Administered 2013-04-11: 6.25 mg via INTRAVENOUS
  Filled 2013-04-11: qty 1

## 2013-04-11 MED ORDER — OXYCODONE-ACETAMINOPHEN 7.5-325 MG PO TABS: 1.0000 | ORAL_TABLET | ORAL | Status: DC | PRN

## 2013-04-11 MED ORDER — DEXAMETHASONE SODIUM PHOSPHATE 10 MG/ML IJ SOLN
INTRAMUSCULAR | Status: DC | PRN
  Administered 2013-04-11: 10 mg via INTRAVENOUS

## 2013-04-11 MED ORDER — SODIUM CHLORIDE 0.9 % IV SOLN: INTRAVENOUS | Status: DC

## 2013-04-11 MED ORDER — FENTANYL CITRATE 0.05 MG/ML IJ SOLN
INTRAMUSCULAR | Status: DC | PRN
  Administered 2013-04-11: 100 ug via INTRAVENOUS
  Administered 2013-04-11: 25 ug via INTRAVENOUS
  Administered 2013-04-11: 50 ug via INTRAVENOUS
  Administered 2013-04-11: 25 ug via INTRAVENOUS
  Administered 2013-04-11 (×2): 50 ug via INTRAVENOUS

## 2013-04-11 MED ORDER — CHLORHEXIDINE GLUCONATE 4 % EX LIQD: 1.0000 "application " | Freq: Once | CUTANEOUS | Status: DC

## 2013-04-11 MED ORDER — MEPERIDINE HCL 50 MG/ML IJ SOLN: 6.2500 mg | INTRAMUSCULAR | Status: DC | PRN

## 2013-04-11 MED ORDER — NEOSTIGMINE METHYLSULFATE 1 MG/ML IJ SOLN
INTRAMUSCULAR | Status: DC | PRN
  Administered 2013-04-11: 4 mg via INTRAVENOUS

## 2013-04-11 MED ORDER — SUCCINYLCHOLINE CHLORIDE 20 MG/ML IJ SOLN
INTRAMUSCULAR | Status: DC | PRN
  Administered 2013-04-11: 100 mg via INTRAVENOUS

## 2013-04-11 MED ORDER — OXYCODONE HCL 5 MG PO TABS
5.0000 mg | ORAL_TABLET | ORAL | Status: DC | PRN
Start: 2013-04-11 — End: 2013-04-11

## 2013-04-11 MED ORDER — LABETALOL HCL 5 MG/ML IV SOLN
INTRAVENOUS | Status: DC | PRN
  Administered 2013-04-11 (×3): 5 mg via INTRAVENOUS

## 2013-04-11 MED ORDER — BUPIVACAINE-EPINEPHRINE 0.5% -1:200000 IJ SOLN
INTRAMUSCULAR | Status: AC
  Filled 2013-04-11: qty 1

## 2013-04-11 MED ORDER — CEFAZOLIN SODIUM-DEXTROSE 2-3 GM-% IV SOLR
INTRAVENOUS | Status: AC
  Filled 2013-04-11: qty 50

## 2013-04-11 MED ORDER — GLYCOPYRROLATE 0.2 MG/ML IJ SOLN
INTRAMUSCULAR | Status: DC | PRN
  Administered 2013-04-11: .6 mg via INTRAVENOUS

## 2013-04-11 MED ORDER — SODIUM CHLORIDE 0.9 % IV SOLN: 250.0000 mL | INTRAVENOUS | Status: DC | PRN

## 2013-04-11 MED ORDER — HYDROMORPHONE HCL PF 1 MG/ML IJ SOLN
INTRAMUSCULAR | Status: DC | PRN
  Administered 2013-04-11 (×2): .5 mg via INTRAVENOUS

## 2013-04-11 MED ORDER — KETOROLAC TROMETHAMINE 30 MG/ML IJ SOLN
INTRAMUSCULAR | Status: DC | PRN
  Administered 2013-04-11: 30 mg via INTRAVENOUS

## 2013-04-11 MED ORDER — CEFAZOLIN SODIUM-DEXTROSE 2-3 GM-% IV SOLR
2.0000 g | INTRAVENOUS | Status: AC
  Administered 2013-04-11: 2 g via INTRAVENOUS

## 2013-04-11 MED ORDER — ACETAMINOPHEN 325 MG PO TABS: 650.0000 mg | ORAL_TABLET | ORAL | Status: DC | PRN

## 2013-04-11 MED ORDER — CISATRACURIUM BESYLATE (PF) 10 MG/5ML IV SOLN
INTRAVENOUS | Status: DC | PRN
  Administered 2013-04-11: 2 mg via INTRAVENOUS
  Administered 2013-04-11: 6 mg via INTRAVENOUS

## 2013-04-11 MED ORDER — OXYCODONE HCL 5 MG/5ML PO SOLN
5.0000 mg | Freq: Once | ORAL | Status: DC | PRN
  Filled 2013-04-11: qty 5

## 2013-04-11 MED ORDER — PROPOFOL 10 MG/ML IV BOLUS
INTRAVENOUS | Status: DC | PRN
  Administered 2013-04-11: 170 mg via INTRAVENOUS

## 2013-04-11 MED ORDER — OXYCODONE HCL 5 MG PO TABS: 5.0000 mg | ORAL_TABLET | Freq: Once | ORAL | Status: DC | PRN

## 2013-04-11 MED ORDER — LACTATED RINGERS IV SOLN
INTRAVENOUS | Status: DC | PRN
  Administered 2013-04-11 (×2): via INTRAVENOUS

## 2013-04-11 MED ORDER — MIDAZOLAM HCL 5 MG/5ML IJ SOLN
INTRAMUSCULAR | Status: DC | PRN
  Administered 2013-04-11: 2 mg via INTRAVENOUS

## 2013-04-11 MED ORDER — BUPIVACAINE-EPINEPHRINE 0.5% -1:200000 IJ SOLN
INTRAMUSCULAR | Status: DC | PRN
  Administered 2013-04-11: 15 mL

## 2013-04-11 MED ORDER — MORPHINE SULFATE 10 MG/ML IJ SOLN: 2.0000 mg | INTRAMUSCULAR | Status: DC | PRN

## 2013-04-11 MED ORDER — HYDROMORPHONE HCL PF 1 MG/ML IJ SOLN: 0.2500 mg | INTRAMUSCULAR | Status: DC | PRN

## 2013-04-11 MED ORDER — SODIUM CHLORIDE 0.9 % IJ SOLN: 3.0000 mL | INTRAMUSCULAR | Status: DC | PRN

## 2013-04-11 MED ORDER — ONDANSETRON HCL 4 MG/2ML IJ SOLN: 4.0000 mg | Freq: Four times a day (QID) | INTRAMUSCULAR | Status: DC | PRN

## 2013-04-11 SURGICAL SUPPLY — 63 items
BANDAGE GAUZE ELAST BULKY 4 IN (GAUZE/BANDAGES/DRESSINGS) IMPLANT
BENZOIN TINCTURE PRP APPL 2/3 (GAUZE/BANDAGES/DRESSINGS) IMPLANT
BLADE HEX COATED 2.75 (ELECTRODE) ×8 IMPLANT
BLADE SURG 15 STRL LF DISP TIS (BLADE) IMPLANT
BLADE SURG 15 STRL SS (BLADE)
BLADE SURG ROTATE 9660 (MISCELLANEOUS) ×4 IMPLANT
BLADE SURG SZ10 CARB STEEL (BLADE) IMPLANT
CANISTER SUCTION 2500CC (MISCELLANEOUS) IMPLANT
CATH ROBINSON RED A/P 14FR (CATHETERS) ×4 IMPLANT
CLOTH BEACON ORANGE TIMEOUT ST (SAFETY) ×8 IMPLANT
COVER SURGICAL LIGHT HANDLE (MISCELLANEOUS) ×4 IMPLANT
DECANTER SPIKE VIAL GLASS SM (MISCELLANEOUS) IMPLANT
DERMABOND ADVANCED (GAUZE/BANDAGES/DRESSINGS) ×2
DERMABOND ADVANCED .7 DNX12 (GAUZE/BANDAGES/DRESSINGS) ×6 IMPLANT
DRAIN PENROSE 18X1/2 LTX STRL (DRAIN) ×4 IMPLANT
DRAPE LAPAROTOMY T 102X78X121 (DRAPES) IMPLANT
DRAPE LAPAROTOMY TRNSV 102X78 (DRAPE) ×4 IMPLANT
ELECT NEEDLE TIP 2.8 STRL (NEEDLE) ×4 IMPLANT
ELECT REM PT RETURN 9FT ADLT (ELECTROSURGICAL) ×4
ELECTRODE REM PT RTRN 9FT ADLT (ELECTROSURGICAL) ×3 IMPLANT
GLOVE BIO SURGEON STRL SZ7 (GLOVE) ×4 IMPLANT
GLOVE BIOGEL M 7.0 STRL (GLOVE) ×4 IMPLANT
GLOVE BIOGEL PI IND STRL 7.0 (GLOVE) ×6 IMPLANT
GLOVE BIOGEL PI INDICATOR 7.0 (GLOVE) ×2
GLOVE EUDERMIC 7 POWDERFREE (GLOVE) ×4 IMPLANT
GOWN STRL NON-REIN LRG LVL3 (GOWN DISPOSABLE) ×4 IMPLANT
GOWN STRL REIN XL XLG (GOWN DISPOSABLE) ×8 IMPLANT
KIT BASIN OR (CUSTOM PROCEDURE TRAY) ×4 IMPLANT
MANIFOLD NEPTUNE II (INSTRUMENTS) ×4 IMPLANT
MESH ULTRAPRO 3X6 7.6X15CM (Mesh General) ×4 IMPLANT
NEEDLE HYPO 22GX1.5 SAFETY (NEEDLE) IMPLANT
NEEDLE HYPO 25X1 1.5 SAFETY (NEEDLE) ×4 IMPLANT
NS IRRIG 1000ML POUR BTL (IV SOLUTION) ×4 IMPLANT
PACK BASIC VI WITH GOWN DISP (CUSTOM PROCEDURE TRAY) IMPLANT
PACK GENERAL/GYN (CUSTOM PROCEDURE TRAY) ×4 IMPLANT
PENCIL BUTTON HOLSTER BLD 10FT (ELECTRODE) IMPLANT
SPONGE GAUZE 4X4 12PLY (GAUZE/BANDAGES/DRESSINGS) IMPLANT
SPONGE LAP 4X18 X RAY DECT (DISPOSABLE) ×4 IMPLANT
STAPLER VISISTAT 35W (STAPLE) IMPLANT
STRIP CLOSURE SKIN 1/2X4 (GAUZE/BANDAGES/DRESSINGS) IMPLANT
SUPPORT SCROTAL LG STRP (MISCELLANEOUS) IMPLANT
SUT CHROMIC 2 0 SH (SUTURE) ×4 IMPLANT
SUT CHROMIC 3 0 PS 2 (SUTURE) ×8 IMPLANT
SUT CHROMIC 3 0 SH 27 (SUTURE) ×4 IMPLANT
SUT MNCRL AB 4-0 PS2 18 (SUTURE) ×4 IMPLANT
SUT PROLENE 2 0 CT2 30 (SUTURE) ×12 IMPLANT
SUT SILK 2 0 (SUTURE) ×1
SUT SILK 2 0 SH (SUTURE) IMPLANT
SUT SILK 2-0 18XBRD TIE 12 (SUTURE) ×3 IMPLANT
SUT VIC AB 2-0 SH 27 (SUTURE) ×1
SUT VIC AB 2-0 SH 27X BRD (SUTURE) ×3 IMPLANT
SUT VIC AB 3-0 PS2 18 (SUTURE) ×1
SUT VIC AB 3-0 PS2 18XBRD (SUTURE) ×3 IMPLANT
SUT VIC AB 3-0 SH 27 (SUTURE) ×1
SUT VIC AB 3-0 SH 27XBRD (SUTURE) ×3 IMPLANT
SUT VICRYL 0 TIES 12 18 (SUTURE) IMPLANT
SUT VICRYL 2 0 18  UND BR (SUTURE) ×1
SUT VICRYL 2 0 18 UND BR (SUTURE) ×3 IMPLANT
SYR BULB IRRIGATION 50ML (SYRINGE) IMPLANT
SYR CONTROL 10ML LL (SYRINGE) ×4 IMPLANT
TOWEL OR 17X26 10 PK STRL BLUE (TOWEL DISPOSABLE) ×4 IMPLANT
WATER STERILE IRR 1500ML POUR (IV SOLUTION) IMPLANT
YANKAUER SUCT BULB TIP 10FT TU (MISCELLANEOUS) IMPLANT

## 2013-04-11 NOTE — Interval H&P Note (Signed)
History and Physical Interval Note:  04/11/2013 9:53 AM  Marc Hancock  has presented today for surgery, with the diagnosis of right inguinal hernia  The goals and the various methods of treatment have been discussed with the patient and family. After consideration of risks, benefits and other options for treatment, the patient has consented to  Procedure(s): HERNIA REPAIR INGUINAL ADULT (Right) BILATERAL VASECTOMY (Bilateral) RIGHT HYDROCELECTOMY ADULT (Right) as a surgical intervention .  The patient's history has been reviewed, patient examined, no change in status, stable for surgery.  I have reviewed the patient's chart and labs.  Questions were answered to the patient's satisfaction.     Ernestene Mention

## 2013-04-11 NOTE — Transfer of Care (Signed)
Immediate Anesthesia Transfer of Care Note  Patient: Marc Hancock  Procedure(s) Performed: Procedure(s): HERNIA REPAIR INGUINAL ADULT (Right) BILATERAL VASECTOMY (Bilateral) RIGHT HYDROCELECTOMY ADULT (Right)  Patient Location: PACU  Anesthesia Type:General  Level of Consciousness: awake, alert  and oriented  Airway & Oxygen Therapy: Patient Spontanous Breathing and Patient connected to face mask oxygen  Post-op Assessment: Report given to PACU RN and Post -op Vital signs reviewed and stable  Post vital signs: Reviewed and stable  Complications: No apparent anesthesia complications

## 2013-04-11 NOTE — Anesthesia Preprocedure Evaluation (Addendum)
Anesthesia Evaluation  Patient identified by MRN, date of birth, ID band Patient awake    Reviewed: Allergy & Precautions, H&P , NPO status , Patient's Chart, lab work & pertinent test results  History of Anesthesia Complications (+) PONV  Airway Mallampati: II TM Distance: >3 FB Neck ROM: Full    Dental  (+) Dental Advisory Given and Teeth Intact   Pulmonary neg pulmonary ROS,  breath sounds clear to auscultation        Cardiovascular hypertension, Pt. on medications Rhythm:Regular Rate:Normal     Neuro/Psych negative neurological ROS  negative psych ROS   GI/Hepatic Neg liver ROS, GERD-  Medicated,  Endo/Other  negative endocrine ROS  Renal/GU negative Renal ROS     Musculoskeletal negative musculoskeletal ROS (+)   Abdominal   Peds  Hematology negative hematology ROS (+)   Anesthesia Other Findings   Reproductive/Obstetrics                          Anesthesia Physical Anesthesia Plan  ASA: II  Anesthesia Plan: General   Post-op Pain Management:    Induction: Intravenous  Airway Management Planned: Oral ETT  Additional Equipment:   Intra-op Plan:   Post-operative Plan: Extubation in OR  Informed Consent: I have reviewed the patients History and Physical, chart, labs and discussed the procedure including the risks, benefits and alternatives for the proposed anesthesia with the patient or authorized representative who has indicated his/her understanding and acceptance.   Dental advisory given  Plan Discussed with: CRNA  Anesthesia Plan Comments:        Anesthesia Quick Evaluation

## 2013-04-11 NOTE — Anesthesia Postprocedure Evaluation (Signed)
  Anesthesia Post-op Note  Patient: Marc Hancock  Procedure(s) Performed: Procedure(s) (LRB): HERNIA REPAIR INGUINAL ADULT (Right) BILATERAL VASECTOMY (Bilateral) RIGHT HYDROCELECTOMY ADULT (Right)  Patient Location: PACU  Anesthesia Type: General  Level of Consciousness: awake and alert   Airway and Oxygen Therapy: Patient Spontanous Breathing  Post-op Pain: mild  Post-op Assessment: Post-op Vital signs reviewed, Patient's Cardiovascular Status Stable, Respiratory Function Stable, Patent Airway and No signs of Nausea or vomiting  Last Vitals:  Filed Vitals:   04/11/13 1453  BP: 144/88  Pulse: 80  Temp: 36.7 C  Resp: 14    Post-op Vital Signs: stable   Complications: No apparent anesthesia complications

## 2013-04-11 NOTE — Preoperative (Signed)
Beta Blockers   Reason not to administer Beta Blockers:Not Applicable 

## 2013-04-11 NOTE — Op Note (Signed)
Marc Hancock is a 42 y.o.   04/11/2013  General   Pre-op diagnosis: Right inguinal hernia. Right hydrocele. Elective sterilization.  Postop diagnosis: Same  Procedure done: Right hydrocelectomy. Bilateral vasectomy  Surgeon: Wendie Simmer. Canary Fister  Anesthesia: General  Indication: Patient is a 42 years old male who was found and physical examination for swelling of the right inguinal area and scrotum to have a right inguinal hernia and right hydrocele. He was seen in consultation by Dr. Derrell Lolling. He also wanted to have a vasectomy. He is scheduled today for right inguinal hernia repair by Dr. Derrell Lolling and right hydrocelectomy and bilateral vasectomy by myself. The procedures, risks and benefits were discussed with the patient. He gave informed consent.  Procedure:    The right inguinal hernia repair was already done by Dr. Derrell Lolling and at that point I came in to the room for the procedures.  The patient was identified by his wrist band and proper timeout was taken.  Patient was already prepped and draped. Under anesthesia a longitudinal incision was made on the right scrotum. The incision was carried down to the tunica vaginalis which was then incised. About 150 cc of clear fluid was drained out of the hydrocele sac. The testicle was then delivered through the wound. The vas was identified and secured with a vas clamp. The vas was then dissected from the surrounding tissues and incised. A 1 cm segment of the vas was excised. The proximal end of vas was fulgurated by placing a needle point Bovie through the lumen of the vas. Then the vas was doubly ligated with #2-0 Vicryl. The distal end was also fulgurated and doubly ligated with #2-0 Vicryl. The sheath of the vas was then approximated over the distal end of the vas.  The tunica vaginalis was then plicated with #3-0 chromic using the Lord's technique. Hemostasis was secured with electrocautery. The testicle was then replaced in the scrotum. The scrotal  incision was then closed in 2 layers with #3-0 chromic.  A 1 cm incision was made on the left scrotum the vas was then secured with a vas clamp. It was then dissected from the surrounding tissues. The vas was then transected and a 1 cm segment of vas was excised. The proximal end of the vas was then fulgurated with a needle point Bovie through the lumen of the vas. The distal end of the vas was also fulgurated with electrocautery and doubly ligated with #2-0 Vicryl. The sheath of the vas was then closed over the distal end of the vas. The vas was then replaced in the scrotum. The incision was closed with 1 stitch of #3-0 chromic.   EBL:  minimal   Needles, sponges count: Correct  The patient tolerated the procedure well and left the OR in satisfactory condition to postanesthesia care unit.  CC: Dr. Claud Kelp

## 2013-04-11 NOTE — Anesthesia Procedure Notes (Signed)
Procedure Name: Intubation Date/Time: 04/11/2013 10:21 AM Performed by: Leroy Libman L Patient Re-evaluated:Patient Re-evaluated prior to inductionOxygen Delivery Method: Circle system utilized Preoxygenation: Pre-oxygenation with 100% oxygen Intubation Type: IV induction Ventilation: Mask ventilation without difficulty and Oral airway inserted - appropriate to patient size Laryngoscope Size: Miller and 3 Grade View: Grade II Tube type: Oral Tube size: 8.0 mm Number of attempts: 1 Airway Equipment and Method: Stylet Placement Confirmation: ETT inserted through vocal cords under direct vision,  breath sounds checked- equal and bilateral and positive ETCO2 Secured at: 21 cm Tube secured with: Tape Dental Injury: Teeth and Oropharynx as per pre-operative assessment

## 2013-04-11 NOTE — Op Note (Signed)
Patient Name:           Eliseo Withers   Date of Surgery:        04/11/2013  Pre op Diagnosis:      Right inguinal hernia  Post op Diagnosis:    Right inguinal hernia  Procedure:                 Open repair right inguinal hernia with mesh  Surgeon:                     Angelia Mould. Derrell Lolling, M.D., FACS  Assistant:                      none  Operative Indications:   Mickie Kozikowski is a 42 y.o. male. He was referred by Dr. Su Grand for evaluation of a right inguinal hernia. His primary care physician is Dr. Artist Pais.  The patient states that he's had a swelling in his right groin and scrotum for about 5 years. He says he gets larger and smaller but never goes away. Lately he's been having some pain. He also reports some back pain and right leg pain, he does a lot of heavy lifting at work. An ultrasound was done which shows a large hydrocele on the right side.  He saw Dr. Brunilda Payor. He requested a vasectomy and management of the swelling. Dr. Brunilda Payor would like to perform a vasectomy and repair his hydrocele but wanted me to evaluate the patient for a hernia. On examination, standing, he was found to have a lemon sized right inguinal hernia that was reducible. The patient is generally healthy. He has hypertension and GERD. No prior hernia problems or surgery. He is brought to the operating room electively   Operative Findings:       The patient had a large, indirect right inguinal hernia.  Procedure in Detail:          Following the induction of general endotracheal anesthesia the patient's abdomen and genitalia were prepped and draped in a sterile fashion. Towels were placed over the genitalia for the hernia repair. Intravenous antibiotics were given and a surgical time out was performed. 0.5% Marcaine with epinephrine was used as a local infiltration anesthetic. A transverse incision was made in the right groin, overlying the right inguinal canal. Dissection was carried down through subcutaneous tissue exposing the  aponeurosis of the external oblique. The external oblique was incised in the direction of its fibers, opening of the external inguinal ring. The external oblique was dissected away from the underlying tissues and self-retaining retractors were placed. The cord structures were mobilized and encircled with a Penrose drain. Ileoinguinal nerve was identified and was intimately associated with the cord structures. The ilioinguinal nerve is dissected back to its emergence from the muscles laterally, clamped, divided, and tied off with a 2-0 silk tie. The redundant nerve medially was resected.  Cremasteric muscle fibers were skeletonized off the cord. Chronic, large right indirect  hernia sac was slowly dissected away from the underlying tissues. The indirect hernia sac was dissected all the way back to the level of the internal ring. The indirect sac was opened inspected and found to be empty. The indirect sac was twisted and suture ligated at the level of the internal ring with a suture ligature of 2-0 Vicryl. The redundant sac was excised and discarded. The internal ring was tightened with a figure-of-eight suture of 2-0 Vicryl laterally. The floor of the inguinal canal  was repaired and reinforced with a 3" x 6" piece of UltraPro mesh. The mesh was trimmed the corners to accommodate the anatomy of the  the wound. The mesh was sutured in place with running sutures of 2-0 Prolene and interrupted mattress sutures of 2-0 Prolene. The mesh was sutured so as to generously overlap the fascia at pubic tubercle, and along the inguinal ligament inferiorly. Medially, superiorly, and superolaterally interrupted mattress sutures were placed to secure the mesh to the underlying muscle. The mesh was incised laterally so as to wrap around the cord structures at the internal ring. The tails of the mesh were overlapped laterally and further sutures were placed medially and laterally to complete the repair. This provided very secure  coverage and repair both medial and lateral to the internal ring, but allowed adequate fingertip opening for the cord structures. Hemostasis was excellent.  After irrigating the wound, the external oblique was closed with a running suture of 2-0 Vicryl placing the cord structures deep to the external oblique. Scarpa's fascia was closed with 3-0 Vicryl sutures and the skin closed with a running subcuticular suture of 4-0 Monocryl and Dermabond. The patient tolerated this procedure well. EBL to this point was about 10 cc. Complications none. Counts correct.  At this point in time I scrubbed out and Dr. Su Grand scrubbed in and he will dictate the hydrocelectomy and vasectomy separately.     Angelia Mould. Derrell Lolling, M.D., FACS General and Minimally Invasive Surgery Breast and Colorectal Surgery  04/11/2013 11:44 AM

## 2013-04-12 ENCOUNTER — Encounter (HOSPITAL_COMMUNITY): Payer: Self-pay | Admitting: General Surgery

## 2013-06-27 ENCOUNTER — Other Ambulatory Visit: Payer: Self-pay | Admitting: Internal Medicine

## 2013-07-18 ENCOUNTER — Other Ambulatory Visit: Payer: Self-pay | Admitting: Internal Medicine

## 2013-08-16 ENCOUNTER — Other Ambulatory Visit: Payer: Self-pay | Admitting: Internal Medicine

## 2013-10-18 ENCOUNTER — Other Ambulatory Visit: Payer: Self-pay | Admitting: Internal Medicine

## 2013-11-07 ENCOUNTER — Other Ambulatory Visit: Payer: Self-pay | Admitting: Internal Medicine

## 2013-11-26 ENCOUNTER — Encounter (HOSPITAL_COMMUNITY): Payer: Self-pay | Admitting: Emergency Medicine

## 2013-11-26 ENCOUNTER — Emergency Department (HOSPITAL_COMMUNITY)
Admission: EM | Admit: 2013-11-26 | Discharge: 2013-11-27 | Disposition: A | Discharge status: Home / Self Care | Payer: Self-pay | Attending: Emergency Medicine | Admitting: Emergency Medicine

## 2013-11-26 DIAGNOSIS — I10 Essential (primary) hypertension: Secondary | ICD-10-CM | POA: Insufficient documentation

## 2013-11-26 DIAGNOSIS — Z9889 Other specified postprocedural states: Secondary | ICD-10-CM | POA: Insufficient documentation

## 2013-11-26 DIAGNOSIS — K219 Gastro-esophageal reflux disease without esophagitis: Secondary | ICD-10-CM | POA: Insufficient documentation

## 2013-11-26 DIAGNOSIS — Z7982 Long term (current) use of aspirin: Secondary | ICD-10-CM | POA: Insufficient documentation

## 2013-11-26 DIAGNOSIS — R638 Other symptoms and signs concerning food and fluid intake: Secondary | ICD-10-CM | POA: Insufficient documentation

## 2013-11-26 DIAGNOSIS — R109 Unspecified abdominal pain: Secondary | ICD-10-CM

## 2013-11-26 DIAGNOSIS — R1110 Vomiting, unspecified: Secondary | ICD-10-CM

## 2013-11-26 DIAGNOSIS — R112 Nausea with vomiting, unspecified: Secondary | ICD-10-CM | POA: Insufficient documentation

## 2013-11-26 DIAGNOSIS — Z79899 Other long term (current) drug therapy: Secondary | ICD-10-CM | POA: Insufficient documentation

## 2013-11-26 DIAGNOSIS — N200 Calculus of kidney: Secondary | ICD-10-CM | POA: Insufficient documentation

## 2013-11-26 LAB — COMPREHENSIVE METABOLIC PANEL
ALT: 21 U/L (ref 0–53)
AST: 26 U/L (ref 0–37)
Albumin: 4.3 g/dL (ref 3.5–5.2)
Alkaline Phosphatase: 106 U/L (ref 39–117)
BUN: 11 mg/dL (ref 6–23)
CO2: 21 mEq/L (ref 19–32)
Calcium: 9.7 mg/dL (ref 8.4–10.5)
Chloride: 100 mEq/L (ref 96–112)
Creatinine, Ser: 1.19 mg/dL (ref 0.50–1.35)
GFR calc Af Amer: 84 mL/min — ABNORMAL LOW (ref >90)
GFR calc non Af Amer: 73 mL/min — ABNORMAL LOW (ref >90)
Glucose, Bld: 132 mg/dL — ABNORMAL HIGH (ref 70–99)
Potassium: 3.4 mEq/L — ABNORMAL LOW (ref 3.7–5.3)
Sodium: 141 mEq/L (ref 137–147)
Total Bilirubin: 0.5 mg/dL (ref 0.3–1.2)
Total Protein: 7.8 g/dL (ref 6.0–8.3)

## 2013-11-26 LAB — CBC WITH DIFFERENTIAL/PLATELET
Basophils Absolute: 0 10*3/uL (ref 0.0–0.1)
Basophils Relative: 0 % (ref 0–1)
Eosinophils Absolute: 0 10*3/uL (ref 0.0–0.7)
Eosinophils Relative: 0 % (ref 0–5)
HCT: 37.8 % — ABNORMAL LOW (ref 39.0–52.0)
Hemoglobin: 13.4 g/dL (ref 13.0–17.0)
Lymphocytes Relative: 12 % (ref 12–46)
Lymphs Abs: 2 10*3/uL (ref 0.7–4.0)
MCH: 30.9 pg (ref 26.0–34.0)
MCHC: 35.4 g/dL (ref 30.0–36.0)
MCV: 87.1 fL (ref 78.0–100.0)
Monocytes Absolute: 1.4 10*3/uL — ABNORMAL HIGH (ref 0.1–1.0)
Monocytes Relative: 8 % (ref 3–12)
Neutro Abs: 13.5 10*3/uL — ABNORMAL HIGH (ref 1.7–7.7)
Neutrophils Relative %: 80 % — ABNORMAL HIGH (ref 43–77)
Platelets: 208 10*3/uL (ref 150–400)
RBC: 4.34 MIL/uL (ref 4.22–5.81)
RDW: 12.6 % (ref 11.5–15.5)
WBC: 16.9 10*3/uL — ABNORMAL HIGH (ref 4.0–10.5)

## 2013-11-26 MED ORDER — ONDANSETRON 4 MG PO TBDP
8.0000 mg | ORAL_TABLET | Freq: Once | ORAL | Status: AC
  Administered 2013-11-26: 8 mg via ORAL
  Filled 2013-11-26: qty 2

## 2013-11-26 NOTE — ED Notes (Signed)
The pt has had nv and back pain since 1700 today no temp

## 2013-11-27 ENCOUNTER — Emergency Department (HOSPITAL_COMMUNITY): Payer: No Typology Code available for payment source

## 2013-11-27 LAB — URINE MICROSCOPIC-ADD ON

## 2013-11-27 LAB — URINALYSIS, ROUTINE W REFLEX MICROSCOPIC
Bilirubin Urine: NEGATIVE
Glucose, UA: NEGATIVE mg/dL
Ketones, ur: NEGATIVE mg/dL
Leukocytes, UA: NEGATIVE
Nitrite: NEGATIVE
Protein, ur: NEGATIVE mg/dL
Specific Gravity, Urine: 1.014 (ref 1.005–1.030)
Urobilinogen, UA: 0.2 mg/dL (ref 0.0–1.0)
pH: 8 (ref 5.0–8.0)

## 2013-11-27 MED ORDER — SODIUM CHLORIDE 0.9 % IV BOLUS (SEPSIS)
1000.0000 mL | Freq: Once | INTRAVENOUS | Status: AC
  Administered 2013-11-27: 1000 mL via INTRAVENOUS

## 2013-11-27 MED ORDER — KETOROLAC TROMETHAMINE 30 MG/ML IJ SOLN
30.0000 mg | Freq: Once | INTRAMUSCULAR | Status: AC
  Administered 2013-11-27: 30 mg via INTRAVENOUS
  Filled 2013-11-27: qty 1

## 2013-11-27 MED ORDER — HYDROCODONE-ACETAMINOPHEN 5-325 MG PO TABS: 2.0000 | ORAL_TABLET | ORAL | Status: DC | PRN

## 2013-11-27 NOTE — ED Notes (Signed)
Zavitz MD at bedside. 

## 2013-11-27 NOTE — ED Notes (Signed)
Pt states understanding of discharge instructions 

## 2013-11-27 NOTE — ED Provider Notes (Signed)
CSN: [id number]     Arrival date & time 11/26/13  2158 History   First MD Initiated Contact with Patient 11/26/13 2259     Chief Complaint  Patient presents with  . Abdominal Pain     (Consider location/radiation/quality/duration/timing/severity/associated sxs/prior Treatment) HPI Comments: 43 year old male with GERD, hypertension, right inguinal hernia surgery in September presents with acute left lower flank pain. Patient has had mild brief episodes in the past that resolved with passing gas or with time. This episode started around 5 PM and has been constant and severe. No history of known kidney stones or diverticular disease. Anus is mildly improved in the ED. The pain is left lower flank. Mild radiation into the groin  Patient is a 43 y.o. male presenting with abdominal pain. The history is provided by the patient.  Abdominal Pain Associated symptoms: nausea and vomiting   Associated symptoms: no chest pain, no chills, no diarrhea, no dysuria, no fever and no shortness of breath     Past Medical History  Diagnosis Date  . GERD (gastroesophageal reflux disease)   . Hypertension   . Allergy    Past Surgical History  Procedure Laterality Date  . Inguinal hernia repair Right 04/11/2013    Procedure: HERNIA REPAIR INGUINAL ADULT;  Surgeon: Ernestene Mention, MD;  Location: [facility];  Service: General;  Laterality: Right;  . Vasectomy Bilateral 04/11/2013    Procedure: BILATERAL VASECTOMY;  Surgeon: Lindaann Slough, MD;  Location: [facility];  Service: Urology;  Laterality: Bilateral;  . Hydrocele excision Right 04/11/2013    Procedure: RIGHT HYDROCELECTOMY ADULT;  Surgeon: Lindaann Slough, MD;  Location: [facility];  Service: Urology;  Laterality: Right;   Family History  Problem Relation Age of Onset  . Alcohol abuse    . Arthritis    . Heart disease    . Stroke    . Depression    . Diabetes     History  Substance Use Topics  . Smoking status: Never Smoker   . Smokeless tobacco:  Never Used  . Alcohol Use: No    Review of Systems  Constitutional: Positive for appetite change. Negative for fever and chills.  HENT: Negative for congestion.   Eyes: Negative for visual disturbance.  Respiratory: Negative for shortness of breath.   Cardiovascular: Negative for chest pain.  Gastrointestinal: Positive for nausea, vomiting and abdominal pain. Negative for diarrhea and blood in stool.  Genitourinary: Positive for flank pain. Negative for dysuria.  Musculoskeletal: Positive for back pain. Negative for neck pain and neck stiffness.  Skin: Negative for rash.  Neurological: Negative for light-headedness and headaches.      Allergies  Review of patient's allergies indicates no known allergies.  Home Medications   Current Outpatient Rx  Name  Route  Sig  Dispense  Refill  . amLODipine (NORVASC) 5 MG tablet   Oral   Take 5 mg by mouth every evening.         Marland Kitchen aspirin EC 81 MG tablet   Oral   Take 81 mg by mouth every evening.         Marland Kitchen losartan (COZAAR) 100 MG tablet   Oral   Take 100 mg by mouth every evening.         Marland Kitchen omeprazole (PRILOSEC) 40 MG capsule   Oral   Take 40 mg by mouth every evening.           BP 132/67  Pulse 95  Temp(Src) 97.9 F (36.6 C) (Oral)  Resp 18  Ht 5\' 7"  (1.702 m)  Wt 165 lb (74.844 kg)  BMI 25.84 kg/m2  SpO2 99% Physical Exam  Nursing note and vitals reviewed. Constitutional: He is oriented to person, place, and time. He appears well-developed and well-nourished.  HENT:  Head: Normocephalic and atraumatic.  Dry mm  Eyes: Conjunctivae are normal. Right eye exhibits no discharge. Left eye exhibits no discharge.  Neck: Normal range of motion. Neck supple. No tracheal deviation present.  Cardiovascular: Normal rate and regular rhythm.   Pulmonary/Chest: Effort normal and breath sounds normal.  Abdominal: Soft. He exhibits no distension. There is no tenderness. There is no guarding.  Genitourinary:  Normal  testicular exam, non tender no swelling  Musculoskeletal: He exhibits tenderness (mild left lower flank). He exhibits no edema.  Neurological: He is alert and oriented to person, place, and time.  Skin: Skin is warm. No rash noted.  Psychiatric: He has a normal mood and affect.    ED Course  Procedures (including critical care time) Emergency Focused Ultrasound Exam Limited retroperitoneal ultrasound of kidneys  Performed and interpreted by Dr. Jodi MourningZavitz Indication: flank pain left Focused abdominal ultrasound with both kidneys imaged in transverse and longitudinal planes in real-time. Interpretation: Left mild hydronephrosis visualized.  No stones or cysts visualized  Images archived electronically  Labs Review Labs Reviewed  CBC WITH DIFFERENTIAL - Abnormal; Notable for the following:    WBC 16.9 (*)    HCT 37.8 (*)    Neutrophils Relative % 80 (*)    Neutro Abs 13.5 (*)    Monocytes Absolute 1.4 (*)    All other components within normal limits  COMPREHENSIVE METABOLIC PANEL - Abnormal; Notable for the following:    Potassium 3.4 (*)    Glucose, Bld 132 (*)    GFR calc non Af Amer 73 (*)    GFR calc Af Amer 84 (*)    All other components within normal limits  URINALYSIS, ROUTINE W REFLEX MICROSCOPIC   Imaging Review Ct Abdomen Pelvis Wo Contrast  11/27/2013   CLINICAL DATA:  Nausea vomiting and back pain  EXAM: CT ABDOMEN AND PELVIS WITHOUT CONTRAST  TECHNIQUE: Multidetector CT imaging of the abdomen and pelvis was performed following the standard protocol without intravenous contrast.  COMPARISON:  Prior CT abdomen/ pelvis 02/26/2006  FINDINGS: Lower Chest: Mild dependent atelectasis in the lower lobes. Otherwise, the lungs are clear. Visualized cardiac structures within normal limits for size. Unremarkable visualized distal thoracic esophagus.  Abdomen: Unenhanced CT was performed per clinician order. Lack of IV contrast limits sensitivity and specificity, especially for  evaluation of abdominal/pelvic solid viscera. Within these limitations, unremarkable CT appearance of the stomach, duodenum, spleen, adrenal glands, pancreas and liver. Gallbladder is unremarkable. No intra or extrahepatic biliary ductal dilatation.  Extensive left renal edema with perinephric stranding and mild hydroureteronephrosis. There is diffuse periureteral stranding. No additional nephrolithiasis identified.  Pelvis: Punctate stone in the region of the left ureterovesicular junction. Otherwise, unremarkable appearance of the bladder, prostate gland and seminal vesicles. No free fluid or suspicious adenopathy.  Bones/Soft Tissues: No acute fracture or aggressive appearing lytic or blastic osseous lesion. Multifocal degenerative disc disease in the lumbar spine. Changes most significant at L2-L3.  Vascular: The atherosclerotic vascular calcifications in the abdominal aorta and proximal iliac vessels without aneurysmal dilatation.  IMPRESSION: 1. Obstructing punctate ureteral stone at the left UVJ with resultant mild left hydronephrosis and the diffuse perinephric stranding and left renal edema. 2. No additional nephrolithiasis identified. 3. Atherosclerotic  vascular calcifications. 4. Lower lumbar degenerative disc disease worse at L2-L3.   Electronically Signed   By: Malachy Moan M.D.   On: 11/27/2013 01:19     EKG Interpretation None      MDM   Final diagnoses:  Kidney stone on left side  Left flank pain  Vomiting   Clinical concern for acute kidney stone, other differential includes diverticulitis, colitis, small hernia, other. Patient improved on my exam, Toradol and IV fluids ordered. Patient had Zofran prior to my arrival in the room. Bedside ultrasound showed mild hydronephrosis in the left kidney. With white blood cell elevation and no history of kidney stone CAT scan done to look for other etiologies and/or to confirm kidney stone.  CT stone study ordered. Patient does have a  white blood cell count elevation however clinically I feel this is likely from stress response and has one vomiting episode. Urine and CT pending  Pain resolved on recheck, well-appearing. CT scan is positive for punctate left renal stone. Followup with urology discussed as needed. Reasons to return given.  Results and differential diagnosis were discussed with the patient. Close follow up outpatient was discussed, patient comfortable with the plan.   Filed Vitals:   11/26/13 2215 11/26/13 2315 11/27/13 0050  BP: 160/91 132/67 132/59  Pulse: 91 95 84  Temp: 97.9 F (36.6 C)  98.7 F (37.1 C)  TempSrc: Oral  Oral  Resp: 18  19  Height: 5\' 7"  (1.702 m)    Weight: 165 lb (74.844 kg)    SpO2: 100% 99% 100%       Enid Skeens, MD 11/27/13 [phone]

## 2013-11-27 NOTE — Discharge Instructions (Signed)
Take ibuprofen every 6 hrs for pain. For severe pain take norco or vicodin however realize they have the potential for addiction and it can make you sleepy and has tylenol in it.  No operating machinery while taking. Follow up with urology if pain persists or new concerns.  If you were given medicines take as directed.  If you are on coumadin or contraceptives realize their levels and effectiveness is altered by many different medicines.  If you have any reaction (rash, tongues swelling, other) to the medicines stop taking and see a physician.   Please follow up as directed and return to the ER or see a physician for new or worsening symptoms.  Thank you. Filed Vitals:   11/26/13 2215 11/26/13 2315 11/27/13 0050  BP: 160/91 132/67 132/59  Pulse: 91 95 84  Temp: 97.9 F (36.6 C)  98.7 F (37.1 C)  TempSrc: Oral  Oral  Resp: 18  19  Height: 5\' 7"  (1.702 m)    Weight: 165 lb (74.844 kg)    SpO2: 100% 99% 100%

## 2013-12-07 ENCOUNTER — Other Ambulatory Visit: Payer: Self-pay | Admitting: Internal Medicine

## 2013-12-29 ENCOUNTER — Telehealth: Payer: Self-pay | Admitting: Internal Medicine

## 2013-12-29 ENCOUNTER — Ambulatory Visit: Payer: No Typology Code available for payment source | Admitting: Internal Medicine

## 2013-12-29 NOTE — Telephone Encounter (Signed)
No note needed. Pt scheduled appt

## 2014-01-01 ENCOUNTER — Ambulatory Visit (INDEPENDENT_AMBULATORY_CARE_PROVIDER_SITE_OTHER): Payer: BC Managed Care – PPO | Admitting: Internal Medicine

## 2014-01-01 ENCOUNTER — Encounter: Payer: Self-pay | Admitting: Internal Medicine

## 2014-01-01 VITALS — BP 122/84 | HR 72 | Temp 98.7°F | Ht 67.0 in | Wt 164.0 lb

## 2014-01-01 DIAGNOSIS — N200 Calculus of kidney: Secondary | ICD-10-CM

## 2014-01-01 DIAGNOSIS — I10 Essential (primary) hypertension: Secondary | ICD-10-CM

## 2014-01-01 MED ORDER — LOSARTAN POTASSIUM 100 MG PO TABS: 100.0000 mg | ORAL_TABLET | Freq: Every evening | ORAL | Status: DC

## 2014-01-01 MED ORDER — PRAVASTATIN SODIUM 20 MG PO TABS: 20.0000 mg | ORAL_TABLET | Freq: Every day | ORAL | Status: DC

## 2014-01-01 MED ORDER — AMLODIPINE BESYLATE 5 MG PO TABS: 5.0000 mg | ORAL_TABLET | Freq: Every day | ORAL | Status: DC

## 2014-01-01 NOTE — Assessment & Plan Note (Signed)
Well controlled.  Monitor electrolytes and kidney function. I calculated his 10 year cardiovascular risk. It was 5.8% with 50% lifetime ASCVD. Patient started on Pravastatin 20 mg once daily. Arrange followup labs in 3 months. BP: 122/84 mmHg

## 2014-01-01 NOTE — Progress Notes (Signed)
Pre visit review using our clinic review tool, if applicable. No additional management support is needed unless otherwise documented below in the visit note. 

## 2014-01-01 NOTE — Assessment & Plan Note (Addendum)
Patient seen in emergency room on 11/27/2013 centimeters signs and symptoms of acute nephrolithiasis. CT of abdomen and pelvis-it showed punctate stone in left UVJ with resultant mild left hydronephrosis. Patient reports he thinks he passed kidney stone. His urinary symptoms and flank pain completely resolved.  Monitor electrolytes and kidney function. I stressed the importance of adequate hydration. Patient to avoid drinking tea and dark colas.  Patient education handout for kidney stone provided.

## 2014-01-01 NOTE — Progress Notes (Signed)
Subjective:    Patient ID: Marc Hancock, male    DOB: 1971-01-30, 43 y.o.   MRN: [id number]  HPI  43 year old [place] with history of hypertension for emergency room followup. Patient seen in the ER on 11/27/2013 secondary to severe acute left lower flank pain. Emergency room evaluation included CT of abdomen and pelvis. It was notable for obstructing punctate ureteral stone at the left UVJ with resultant mild left hydronephrosis. He also had diffuse perinephric stranding and left renal edema. No additional nephrolithiasis identified.  Patient had an incidental atherosclerotic vascular calcifications.  Patient thinks she may have passed a stone but he did not use urine strainer. His left flank pain completely resolved.  Patient does drink quite a bit of sweet tea. She denies excessive consumption of dark cola's. He denies family history of kidney stones.  Hypertension-well-controlled. Continue cardiovascular risk calculated. He has family history of heart disease.  Review of Systems No dysuria, urinary frequency or hematuria     Past Medical History  Diagnosis Date  . GERD (gastroesophageal reflux disease)   . Hypertension   . Allergy     History   Social History  . Marital Status: Divorced    Spouse Name: N/A    Number of Children: N/A  . Years of Education: N/A   Occupational History  . Not on file.   Social History Main Topics  . Smoking status: Never Smoker   . Smokeless tobacco: Never Used  . Alcohol Use: No  . Drug Use: Yes    Special: Marijuana     Comment: marijuana 2 x daily  . Sexual Activity: Not on file   Other Topics Concern  . Not on file   Social History Narrative  . No narrative on file    Past Surgical History  Procedure Laterality Date  . Inguinal hernia repair Right 04/11/2013    Procedure: HERNIA REPAIR INGUINAL ADULT;  Surgeon: Ernestene MentionHaywood M Ingram, MD;  Location: [facility];  Service: General;  Laterality: Right;  . Vasectomy  Bilateral 04/11/2013    Procedure: BILATERAL VASECTOMY;  Surgeon: Lindaann SloughMarc-Henry Nesi, MD;  Location: [facility];  Service: Urology;  Laterality: Bilateral;  . Hydrocele excision Right 04/11/2013    Procedure: RIGHT HYDROCELECTOMY ADULT;  Surgeon: Lindaann SloughMarc-Henry Nesi, MD;  Location: [facility];  Service: Urology;  Laterality: Right;    Family History  Problem Relation Age of Onset  . Alcohol abuse    . Arthritis    . Heart disease    . Stroke    . Depression    . Diabetes      No Known Allergies  Current Outpatient Prescriptions on File Prior to Visit  Medication Sig Dispense Refill  . amLODipine (NORVASC) 5 MG tablet TAKE 1 TABLET (5 MG TOTAL) BY MOUTH DAILY.  90 tablet  0  . aspirin EC 81 MG tablet Take 81 mg by mouth every evening.      Marland Kitchen. losartan (COZAAR) 100 MG tablet Take 100 mg by mouth every evening.      Marland Kitchen. omeprazole (PRILOSEC) 40 MG capsule Take 40 mg by mouth every evening.        No current facility-administered medications on file prior to visit.    BP 122/84  Pulse 72  Temp(Src) 98.7 F (37.1 C) (Oral)  Ht 5\' 7"  (1.702 m)  Wt 164 lb (74.39 kg)  BMI 25.68 kg/m2    Objective:   Physical Exam  Constitutional: He is oriented to person, place, and time.  He appears well-developed and well-nourished. No distress.  Cardiovascular: Normal rate, regular rhythm and normal heart sounds.   No murmur heard. Pulmonary/Chest: Effort normal and breath sounds normal. He has no wheezes.  Abdominal: Soft. Bowel sounds are normal. There is no tenderness.  No CVA tenderness  Musculoskeletal: He exhibits no edema.  Neurological: He is alert and oriented to person, place, and time. No cranial nerve deficit.  Skin: Skin is warm and dry.  Psychiatric: He has a normal mood and affect. His behavior is normal.          Assessment & Plan:

## 2014-01-01 NOTE — Patient Instructions (Signed)
Avoid drinking tea or dark colas (soft drinks).  Increase your water intake ( 6 glasses per day ) Please complete the following lab tests before your next follow up appointment: FLP, LFTs - 272.4

## 2014-01-02 ENCOUNTER — Telehealth: Payer: Self-pay | Admitting: Internal Medicine

## 2014-01-02 NOTE — Telephone Encounter (Signed)
Relevant patient education mailed to patient.  

## 2014-01-19 ENCOUNTER — Other Ambulatory Visit: Payer: Self-pay | Admitting: Internal Medicine

## 2014-03-31 ENCOUNTER — Other Ambulatory Visit: Payer: Self-pay | Admitting: Internal Medicine

## 2014-04-23 ENCOUNTER — Other Ambulatory Visit: Payer: Self-pay | Admitting: Internal Medicine

## 2014-05-03 ENCOUNTER — Ambulatory Visit (INDEPENDENT_AMBULATORY_CARE_PROVIDER_SITE_OTHER): Payer: BC Managed Care – PPO | Admitting: Physician Assistant

## 2014-05-03 ENCOUNTER — Encounter: Payer: Self-pay | Admitting: Physician Assistant

## 2014-05-03 VITALS — BP 140/80 | HR 72 | Temp 98.4°F | Resp 18 | Wt 169.5 lb

## 2014-05-03 DIAGNOSIS — H10509 Unspecified blepharoconjunctivitis, unspecified eye: Secondary | ICD-10-CM

## 2014-05-03 DIAGNOSIS — H10501 Unspecified blepharoconjunctivitis, right eye: Secondary | ICD-10-CM

## 2014-05-03 MED ORDER — PRAVASTATIN SODIUM 20 MG PO TABS: 20.0000 mg | ORAL_TABLET | Freq: Every day | ORAL | Status: DC

## 2014-05-03 MED ORDER — POLYMYXIN B-TRIMETHOPRIM 10000-0.1 UNIT/ML-% OP SOLN: 1.0000 [drp] | OPHTHALMIC | Status: DC

## 2014-05-03 NOTE — Patient Instructions (Addendum)
Polytrim 1 drop into the affected eye 4 to 6 times daily for 5-7 days. Make sure to use at least for 5 days in order to prevent a rebound infection.  Use warm compresses as discussed on the eye to help speed recovery.  Try to use eye wash to keep the eye clear of debris.  If emergency symptoms discussed during visit developed, seek medical attention immediately.  Followup as needed, or for worsening or persistent symptoms despite treatment.     Blepharitis Blepharitis is a skin problem that makes your eyelids watery, red, puffy (swollen), crusty, scaly, or painful. It may also make your eyes itch. You may lose eyelashes. HOME CARE  Keep your hands clean.  Use a clean towel each time you dry your eyelids. Do not share towels or makeup with anyone.  Carefully wash your eyelids and eyelashes 2 times a day. Use warm water and baby shampoo or just water.  Wash your face and eyebrows at least once a day.  Hold a folded washcloth under warm water. Squeeze the water out. Put the warm washcloth on your eyes 2 times a day for 10 minutes, or as told by your doctor.  Apply medicated cream as told by your doctor.  Avoid rubbing your eyes.  Avoid wearing makeup until you get better.  Follow up with your doctor as told. GET HELP RIGHT AWAY IF:  Your pain, redness, or puffiness gets worse.  Your pain, redness, or puffiness spreads to other parts of your face.  Your vision changes, or you have pain when looking at lights or moving objects.  You have a fever.  You do not get better after 2 to 4 days. MAKE SURE YOU:  Understand these instructions.  Will watch your condition.  Will get help right away if you are not doing well or get worse. Document Released: 05/26/2008 Document Revised: 11/09/2011 Document Reviewed: 09/24/2010 [facility] Patient Information 2015 [place], [place]. This information is not intended to replace advice given to you by your health care provider. Make sure you  discuss any questions you have with your health care provider. Conjunctivitis Conjunctivitis is commonly called "pink eye." Conjunctivitis can be caused by bacterial or viral infection, allergies, or injuries. There is usually redness of the lining of the eye, itching, discomfort, and sometimes discharge. There may be deposits of matter along the eyelids. A viral infection usually causes a watery discharge, while a bacterial infection causes a yellowish, thick discharge. Pink eye is very contagious and spreads by direct contact. You may be given antibiotic eyedrops as part of your treatment. Before using your eye medicine, remove all drainage from the eye by washing gently with warm water and cotton balls. Continue to use the medication until you have awakened 2 mornings in a row without discharge from the eye. Do not rub your eye. This increases the irritation and helps spread infection. Use separate towels from other household members. Wash your hands with soap and water before and after touching your eyes. Use cold compresses to reduce pain and sunglasses to relieve irritation from light. Do not wear contact lenses or wear eye makeup until the infection is gone. SEEK MEDICAL CARE IF:   Your symptoms are not better after 3 days of treatment.  You have increased pain or trouble seeing.  The outer eyelids become very red or swollen. Document Released: 09/24/2004 Document Revised: 11/09/2011 Document Reviewed: 08/17/2005 [facility] Patient Information 2015 [place], [place]. This information is not intended to replace advice given to  you by your health care provider. Make sure you discuss any questions you have with your health care provider.  

## 2014-05-03 NOTE — Progress Notes (Signed)
Subjective:    Patient ID: Marc Hancock, male    DOB: [birth year], 43 y.o.   MRN: [id number]  Eye Problem  The right eye is affected.This is a new problem. The current episode started in the past 7 days. The problem occurs constantly. The problem has been gradually worsening. There was no injury mechanism. The pain is at a severity of 3/10. The pain is mild. There is no known exposure to pink eye. He does not wear contacts. Associated symptoms include blurred vision, an eye discharge, eye redness, itching and photophobia. Pertinent negatives include no double vision, fever, foreign body sensation, nausea, recent URI or vomiting. He has tried commercial eye wash for the symptoms. The treatment provided mild relief.      Review of Systems  Constitutional: Negative for fever and chills.  Eyes: Positive for blurred vision, photophobia, pain (mild), discharge, redness and itching. Negative for double vision and visual disturbance.  Respiratory: Negative for shortness of breath.   Cardiovascular: Negative for chest pain.  Gastrointestinal: Negative for nausea, vomiting and diarrhea.  Skin: Positive for itching.  Neurological: Negative for syncope and headaches.  All other systems reviewed and are negative.    Past Medical History  Diagnosis Date  . GERD (gastroesophageal reflux disease)   . Hypertension   . Allergy     History   Social History  . Marital Status: Divorced    Spouse Name: N/A    Number of Children: N/A  . Years of Education: N/A   Occupational History  . Not on file.   Social History Main Topics  . Smoking status: Never Smoker   . Smokeless tobacco: Never Used  . Alcohol Use: No  . Drug Use: Yes    Special: Marijuana     Comment: marijuana 2 x daily  . Sexual Activity: Not on file   Other Topics Concern  . Not on file   Social History Narrative  . No narrative on file    Past Surgical History  Procedure Laterality Date  . Inguinal hernia repair  Right 04/11/2013    Procedure: HERNIA REPAIR INGUINAL ADULT;  Surgeon: Ernestene Mention, MD;  Location: [facility];  Service: General;  Laterality: Right;  . Vasectomy Bilateral 04/11/2013    Procedure: BILATERAL VASECTOMY;  Surgeon: Lindaann Slough, MD;  Location: [facility];  Service: Urology;  Laterality: Bilateral;  . Hydrocele excision Right 04/11/2013    Procedure: RIGHT HYDROCELECTOMY ADULT;  Surgeon: Lindaann Slough, MD;  Location: [facility];  Service: Urology;  Laterality: Right;    Family History  Problem Relation Age of Onset  . Alcohol abuse    . Arthritis    . Heart disease    . Stroke    . Depression    . Diabetes      No Known Allergies  Current Outpatient Prescriptions on File Prior to Visit  Medication Sig Dispense Refill  . amLODipine (NORVASC) 5 MG tablet TAKE 1 TABLET (5 MG TOTAL) BY MOUTH DAILY.  90 tablet  1  . aspirin EC 81 MG tablet Take 81 mg by mouth every evening.      Marland Kitchen losartan (COZAAR) 100 MG tablet TAKE 1 TABLET BY MOUTH DAILY.  90 tablet  0  . omeprazole (PRILOSEC) 40 MG capsule Take 40 mg by mouth every evening.        No current facility-administered medications on file prior to visit.    EXAM: BP 140/80  Pulse 72  Temp(Src) 98.4 F (36.9 C) (Oral)  Resp 18  Wt 169 lb 8 oz (76.885 kg)     Objective:   Physical Exam  Nursing note and vitals reviewed. Constitutional: He is oriented to person, place, and time. He appears well-developed and well-nourished. No distress.  HENT:  Head: Normocephalic and atraumatic.  Eyes: EOM are normal. Pupils are equal, round, and reactive to light. Right eye exhibits discharge. Left eye exhibits no discharge.  Right eye: Conjunctiva inflamed with visible milky discharge with matting, some scaling at the eyelashes noted and local swelling of the lateral upper lid near the lashes. No periorbital edema. Vision grossly intact.  Left eye: Conjunctiva normal, no discharge or matting, no periorbital edema, vision grossly  intact.  Cardiovascular: Normal rate, regular rhythm and intact distal pulses.   Pulmonary/Chest: Effort normal and breath sounds normal. No respiratory distress. He exhibits no tenderness.  Neurological: He is alert and oriented to person, place, and time.  Skin: Skin is warm and dry. He is not diaphoretic.  Psychiatric: He has a normal mood and affect. His behavior is normal. Judgment and thought content normal.     Lab Results  Component Value Date   WBC 16.9* 11/26/2013   HGB 13.4 11/26/2013   HCT 37.8* 11/26/2013   PLT 208 11/26/2013   GLUCOSE 132* 11/26/2013   CHOL 173 11/21/2012   TRIG 67.0 11/21/2012   HDL 45.70 11/21/2012   LDLCALC 114* 11/21/2012   ALT 21 11/26/2013   AST 26 11/26/2013   NA 141 11/26/2013   K 3.4* 11/26/2013   CL 100 11/26/2013   CREATININE 1.19 11/26/2013   BUN 11 11/26/2013   CO2 21 11/26/2013   TSH 0.48 11/21/2012        Assessment & Plan:  Marc Hancock was seen today for right eye swelling, redness, and irritation.  Diagnoses and associated orders for this visit:  Blepharoconjunctivitis, right Comments: Treat with Polytrim drops and warm compresses. - trimethoprim-polymyxin b (POLYTRIM) ophthalmic solution; Place 1 drop into the right eye every 4 (four) hours.  Other Orders - pravastatin (PRAVACHOL) 20 MG tablet; Take 1 tablet (20 mg total) by mouth daily.    Return precautions provided, and patient handout on blepharitis and conjunctivitis.  Plan to follow up as needed, or for worsening or persistent symptoms despite treatment.  Patient Instructions  Polytrim 1 drop into the affected eye 4 to 6 times daily for 5-7 days. Make sure to use at least for 5 days in order to prevent a rebound infection.  Use warm compresses as discussed on the eye to help speed recovery.  Try to use eye wash to keep the eye clear of debris.  If emergency symptoms discussed during visit developed, seek medical attention immediately.  Followup as needed, or for worsening or  persistent symptoms despite treatment.

## 2014-07-16 ENCOUNTER — Other Ambulatory Visit: Payer: Self-pay | Admitting: Internal Medicine

## 2014-07-23 ENCOUNTER — Other Ambulatory Visit: Payer: Self-pay | Admitting: Internal Medicine

## 2014-10-22 ENCOUNTER — Other Ambulatory Visit: Payer: Self-pay | Admitting: Internal Medicine

## 2014-10-23 ENCOUNTER — Other Ambulatory Visit: Payer: Self-pay | Admitting: Internal Medicine

## 2014-11-17 ENCOUNTER — Other Ambulatory Visit: Payer: Self-pay | Admitting: Internal Medicine

## 2014-11-27 ENCOUNTER — Other Ambulatory Visit: Payer: Self-pay | Admitting: Internal Medicine

## 2014-12-22 ENCOUNTER — Other Ambulatory Visit: Payer: Self-pay | Admitting: Internal Medicine

## 2015-02-08 ENCOUNTER — Other Ambulatory Visit: Payer: Self-pay | Admitting: Internal Medicine

## 2015-02-16 ENCOUNTER — Other Ambulatory Visit: Payer: Self-pay | Admitting: Internal Medicine

## 2015-02-21 ENCOUNTER — Other Ambulatory Visit: Payer: Self-pay | Admitting: Internal Medicine

## 2015-02-26 ENCOUNTER — Other Ambulatory Visit: Payer: Self-pay | Admitting: Internal Medicine

## 2015-03-23 ENCOUNTER — Other Ambulatory Visit: Payer: Self-pay | Admitting: Internal Medicine

## 2015-04-02 ENCOUNTER — Other Ambulatory Visit: Payer: Self-pay | Admitting: Internal Medicine

## 2015-04-23 ENCOUNTER — Other Ambulatory Visit: Payer: Self-pay | Admitting: Internal Medicine

## 2015-04-30 ENCOUNTER — Other Ambulatory Visit: Payer: Self-pay | Admitting: Internal Medicine

## 2015-05-23 ENCOUNTER — Ambulatory Visit (INDEPENDENT_AMBULATORY_CARE_PROVIDER_SITE_OTHER): Payer: BLUE CROSS/BLUE SHIELD | Admitting: Adult Health

## 2015-05-23 ENCOUNTER — Encounter: Payer: Self-pay | Admitting: Adult Health

## 2015-05-23 VITALS — BP 122/78 | HR 68 | Wt 169.4 lb

## 2015-05-23 DIAGNOSIS — Z760 Encounter for issue of repeat prescription: Secondary | ICD-10-CM | POA: Diagnosis not present

## 2015-05-23 DIAGNOSIS — R079 Chest pain, unspecified: Secondary | ICD-10-CM

## 2015-05-23 DIAGNOSIS — I10 Essential (primary) hypertension: Secondary | ICD-10-CM

## 2015-05-23 LAB — CBC WITH DIFFERENTIAL/PLATELET
Basophils Absolute: 0 10*3/uL (ref 0.0–0.1)
Basophils Relative: 0.5 % (ref 0.0–3.0)
Eosinophils Absolute: 0.1 10*3/uL (ref 0.0–0.7)
Eosinophils Relative: 1.3 % (ref 0.0–5.0)
HCT: 35.8 % — ABNORMAL LOW (ref 39.0–52.0)
Hemoglobin: 11.9 g/dL — ABNORMAL LOW (ref 13.0–17.0)
Lymphocytes Relative: 44.9 % (ref 12.0–46.0)
Lymphs Abs: 3.3 10*3/uL (ref 0.7–4.0)
MCHC: 33.2 g/dL (ref 30.0–36.0)
MCV: 90.4 fl (ref 78.0–100.0)
Monocytes Absolute: 0.6 10*3/uL (ref 0.1–1.0)
Monocytes Relative: 7.7 % (ref 3.0–12.0)
Neutro Abs: 3.3 10*3/uL (ref 1.4–7.7)
Neutrophils Relative %: 45.6 % (ref 43.0–77.0)
Platelets: 188 10*3/uL (ref 150.0–400.0)
RBC: 3.96 Mil/uL — ABNORMAL LOW (ref 4.22–5.81)
RDW: 13.9 % (ref 11.5–15.5)
WBC: 7.3 10*3/uL (ref 4.0–10.5)

## 2015-05-23 LAB — BASIC METABOLIC PANEL
BUN: 13 mg/dL (ref 6–23)
CO2: 30 mEq/L (ref 19–32)
Calcium: 9.4 mg/dL (ref 8.4–10.5)
Chloride: 105 mEq/L (ref 96–112)
Creatinine, Ser: 1.19 mg/dL (ref 0.40–1.50)
GFR: 84.64 mL/min (ref >60.00)
Glucose, Bld: 80 mg/dL (ref 70–99)
Potassium: 4.5 mEq/L (ref 3.5–5.1)
Sodium: 141 mEq/L (ref 135–145)

## 2015-05-23 LAB — TSH: TSH: 1.12 u[IU]/mL (ref 0.35–4.50)

## 2015-05-23 MED ORDER — LOSARTAN POTASSIUM 100 MG PO TABS: 100.0000 mg | ORAL_TABLET | Freq: Every day | ORAL | Status: DC

## 2015-05-23 MED ORDER — LORAZEPAM 0.5 MG PO TABS: 0.5000 mg | ORAL_TABLET | Freq: Two times a day (BID) | ORAL | Status: DC | PRN

## 2015-05-23 MED ORDER — AMLODIPINE BESYLATE 5 MG PO TABS: ORAL_TABLET | ORAL | Status: DC

## 2015-05-23 MED ORDER — PRAVASTATIN SODIUM 20 MG PO TABS: ORAL_TABLET | ORAL | Status: DC

## 2015-05-23 MED ORDER — OMEPRAZOLE 40 MG PO CPDR: 40.0000 mg | DELAYED_RELEASE_CAPSULE | Freq: Every evening | ORAL | Status: DC

## 2015-05-23 MED ORDER — LORAZEPAM 0.5 MG PO TABS: 0.5000 mg | ORAL_TABLET | ORAL | Status: DC | PRN

## 2015-05-23 NOTE — Patient Instructions (Signed)
It was great meeting you today!  I will follow up with you regarding your blood work.  Follow up with Dr. Artist Pais or myself for a physical.   Take the Ativan as needed for anxiety

## 2015-05-23 NOTE — Progress Notes (Signed)
   Subjective:    Patient ID: Marc Hancock, male    DOB: November 06, 1970, 44 y.o.   MRN: [id number]  HPI 44 year old male who presents to the office today for medication follow up regarding cozaar. He does not monitor his blood pressure at home. Today the blood pressure is 122/78  On Monday he experienced mid sternal chest pain with SOB and radiating pain into right back and shoulders.No radiating down left arm or jaw. Pain lasted "a couple of hours". He was doing nothing when the chest pain started. It happened around 7:30 am and lasted most of the morning Pain described as "pulling" no chest pain since.   He does endorse that he gets chest pain on occasion and it is usually after a stressful situation. He may feel anxious during these episodes.   Review of Systems  Constitutional: Negative.   HENT: Negative.   Respiratory: Positive for shortness of breath (resolved).   Cardiovascular: Positive for chest pain (resolved). Negative for palpitations and leg swelling.  Gastrointestinal: Negative.   Musculoskeletal: Negative.   Neurological: Negative.   Psychiatric/Behavioral: Negative.   All other systems reviewed and are negative.      Objective:   Physical Exam  Constitutional: He is oriented to person, place, and time. He appears well-developed and well-nourished. No distress.  Cardiovascular: Normal rate, regular rhythm, normal heart sounds and intact distal pulses.  Exam reveals no gallop and no friction rub.   No murmur heard. No carotid bruit  Pulmonary/Chest: Effort normal and breath sounds normal. No respiratory distress. He has no wheezes. He has no rales. He exhibits no tenderness.  Abdominal: Soft. Bowel sounds are normal. He exhibits no distension and no mass. There is no tenderness. There is no rebound and no guarding.  Musculoskeletal: Normal range of motion. He exhibits no edema or tenderness.  Lymphadenopathy:    He has no cervical adenopathy.  Neurological: He is alert and  oriented to person, place, and time. He has normal reflexes.  Skin: Skin is warm and dry. No rash noted. He is not diaphoretic. No erythema. No pallor.  Psychiatric: He has a normal mood and affect. His behavior is normal. Judgment and thought content normal.  Nursing note and vitals reviewed.      Assessment & Plan:  1. Chest pain, unspecified chest pain type - Likely anxiety/stress induced. Will check EKG and blood work to r/o any other etiology.  - EKG 12-Lead- NSR, rate 60 - Basic metabolic panel - CBC with Differential/Platelet - TSH - LORazepam (ATIVAN) 0.5 MG tablet; Take 1 tablet (0.5 mg total) by mouth as needed for anxiety.  Dispense: 10 tablet; Refill: 0  2. Essential hypertension - EKG 12-Lead - Basic metabolic panel - CBC with Differential/Platelet - TSH  3. Medication refill - omeprazole (PRILOSEC) 40 MG capsule; Take 1 capsule (40 mg total) by mouth every evening.  Dispense: 90 capsule; Refill: 3 - pravastatin (PRAVACHOL) 20 MG tablet; TAKE 1 TABLET (20 MG TOTAL) BY MOUTH DAILY.  Dispense: 90 tablet; Refill: 3 - losartan (COZAAR) 100 MG tablet; Take 1 tablet (100 mg total) by mouth daily.  Dispense: 90 tablet; Refill: 3 - amLODipine (NORVASC) 5 MG tablet; TAKE 1 TABLET BY MOUTH EVERY DAY  Dispense: 90 tablet; Refill: 3

## 2015-05-23 NOTE — Progress Notes (Signed)
Pre visit review using our clinic review tool, if applicable. No additional management support is needed unless otherwise documented below in the visit note. 

## 2015-05-27 ENCOUNTER — Telehealth: Payer: Self-pay | Admitting: Adult Health

## 2015-05-27 NOTE — Telephone Encounter (Signed)
Left message on VM to call back

## 2015-11-23 ENCOUNTER — Other Ambulatory Visit: Payer: Self-pay | Admitting: Internal Medicine

## 2016-01-18 ENCOUNTER — Other Ambulatory Visit: Payer: Self-pay | Admitting: Adult Health

## 2016-01-21 NOTE — Telephone Encounter (Signed)
Patient was last seen 05/23/15 Last refilled 05/23/15 for #10 with 0 refills. Ok to refill?

## 2016-01-21 NOTE — Telephone Encounter (Signed)
Does he still need this medication?  

## 2016-01-21 NOTE — Telephone Encounter (Signed)
He had his medications mixed up. He does not need ativan. He is not having any anxiety issues.

## 2016-01-21 NOTE — Telephone Encounter (Signed)
Noted! Thank you

## 2016-01-21 NOTE — Telephone Encounter (Signed)
Patient states he does still need this medication

## 2016-01-21 NOTE — Telephone Encounter (Signed)
Patient states that is has helped tremendously with anxiety and he would like another refill.

## 2016-05-13 ENCOUNTER — Other Ambulatory Visit: Payer: Self-pay | Admitting: Adult Health

## 2016-05-13 DIAGNOSIS — Z760 Encounter for issue of repeat prescription: Secondary | ICD-10-CM

## 2016-05-13 NOTE — Telephone Encounter (Signed)
Ok to refill for 90 days. He needs to establish with a new provider since Dr. Artist PaisYoo is not coming back

## 2016-05-28 ENCOUNTER — Other Ambulatory Visit: Payer: Self-pay | Admitting: Adult Health

## 2016-05-28 DIAGNOSIS — Z760 Encounter for issue of repeat prescription: Secondary | ICD-10-CM

## 2016-05-28 NOTE — Telephone Encounter (Signed)
Can have thirty days. He needs to establish with another provider for additional refills

## 2016-06-04 DIAGNOSIS — Z23 Encounter for immunization: Secondary | ICD-10-CM | POA: Diagnosis not present

## 2016-06-13 ENCOUNTER — Other Ambulatory Visit: Payer: Self-pay | Admitting: Internal Medicine

## 2016-06-15 NOTE — Telephone Encounter (Signed)
Pt has not scheduled appt to establish with new provider in Dr Olegario MessierYoo's absence, as you requested multiple times. Please advise as to refill on Losartan. Thanks!

## 2016-06-15 NOTE — Telephone Encounter (Signed)
Refill declined.   He will need to establish with a new provider for further refills.  Thanks

## 2016-07-05 ENCOUNTER — Other Ambulatory Visit: Payer: Self-pay | Admitting: Adult Health

## 2016-07-05 DIAGNOSIS — Z760 Encounter for issue of repeat prescription: Secondary | ICD-10-CM

## 2016-07-08 ENCOUNTER — Other Ambulatory Visit: Payer: Self-pay | Admitting: Internal Medicine

## 2016-07-20 ENCOUNTER — Other Ambulatory Visit: Payer: Self-pay | Admitting: Internal Medicine

## 2016-08-02 ENCOUNTER — Other Ambulatory Visit: Payer: Self-pay | Admitting: Adult Health

## 2016-08-02 DIAGNOSIS — Z760 Encounter for issue of repeat prescription: Secondary | ICD-10-CM

## 2016-08-04 NOTE — Telephone Encounter (Signed)
I have not seen him in over a year. I am unable to fill his prescriptions until he is seen

## 2016-08-04 NOTE — Telephone Encounter (Signed)
Patient last seen 05/23/15 - ok to refill?

## 2016-08-07 ENCOUNTER — Other Ambulatory Visit: Payer: Self-pay | Admitting: Adult Health

## 2016-08-07 DIAGNOSIS — Z760 Encounter for issue of repeat prescription: Secondary | ICD-10-CM

## 2016-08-12 ENCOUNTER — Other Ambulatory Visit: Payer: Self-pay | Admitting: Adult Health

## 2016-08-12 DIAGNOSIS — Z760 Encounter for issue of repeat prescription: Secondary | ICD-10-CM

## 2016-08-12 NOTE — Telephone Encounter (Signed)
Sent to the pharmacy by e-scribe. 

## 2016-08-12 NOTE — Telephone Encounter (Signed)
He can have thirty days. He has been asked multiple times to follow up for further refills.

## 2016-08-18 ENCOUNTER — Other Ambulatory Visit: Payer: Self-pay | Admitting: Internal Medicine

## 2016-08-20 ENCOUNTER — Other Ambulatory Visit: Payer: Self-pay | Admitting: Emergency Medicine

## 2016-08-20 MED ORDER — LOSARTAN POTASSIUM 100 MG PO TABS: 100.0000 mg | ORAL_TABLET | Freq: Every day | ORAL | 0 refills | Status: DC

## 2016-08-25 ENCOUNTER — Encounter: Payer: Self-pay | Admitting: Adult Health

## 2016-08-25 ENCOUNTER — Ambulatory Visit (INDEPENDENT_AMBULATORY_CARE_PROVIDER_SITE_OTHER): Payer: BLUE CROSS/BLUE SHIELD | Admitting: Adult Health

## 2016-08-25 VITALS — BP 118/74 | Temp 98.2°F | Ht 67.0 in | Wt 171.4 lb

## 2016-08-25 DIAGNOSIS — Z7689 Persons encountering health services in other specified circumstances: Secondary | ICD-10-CM | POA: Diagnosis not present

## 2016-08-25 DIAGNOSIS — Z760 Encounter for issue of repeat prescription: Secondary | ICD-10-CM

## 2016-08-25 DIAGNOSIS — I10 Essential (primary) hypertension: Secondary | ICD-10-CM | POA: Diagnosis not present

## 2016-08-25 MED ORDER — OMEPRAZOLE 40 MG PO CPDR: 40.0000 mg | DELAYED_RELEASE_CAPSULE | Freq: Every evening | ORAL | 3 refills | Status: DC

## 2016-08-25 MED ORDER — LOSARTAN POTASSIUM 100 MG PO TABS: 100.0000 mg | ORAL_TABLET | Freq: Every day | ORAL | 3 refills | Status: DC

## 2016-08-25 MED ORDER — AMLODIPINE BESYLATE 5 MG PO TABS: 5.0000 mg | ORAL_TABLET | Freq: Every day | ORAL | 3 refills | Status: DC

## 2016-08-25 MED ORDER — PRAVASTATIN SODIUM 20 MG PO TABS: 20.0000 mg | ORAL_TABLET | Freq: Every day | ORAL | 3 refills | Status: DC

## 2016-08-25 MED ORDER — ASPIRIN EC 81 MG PO TBEC: 81.0000 mg | DELAYED_RELEASE_TABLET | Freq: Every evening | ORAL | 3 refills | Status: DC

## 2016-08-25 NOTE — Patient Instructions (Signed)
Please follow up for your physical.   Visit an eye doctor.   Start working on diet and exercise.   All of your medications have been sent in  Health Maintenance, Male A healthy lifestyle and preventative care can promote health and wellness.  Maintain regular health, dental, and eye exams.  Eat a healthy diet. Foods like vegetables, fruits, whole grains, low-fat dairy products, and lean protein foods contain the nutrients you need and are low in calories. Decrease your intake of foods high in solid fats, added sugars, and salt. Get information about a proper diet from your health care provider, if necessary.  Regular physical exercise is one of the most important things you can do for your health. Most adults should get at least 150 minutes of moderate-intensity exercise (any activity that increases your heart rate and causes you to sweat) each week. In addition, most adults need muscle-strengthening exercises on 2 or more days a week.   Maintain a healthy weight. The body mass index (BMI) is a screening tool to identify possible weight problems. It provides an estimate of body fat based on height and weight. Your health care provider can find your BMI and can help you achieve or maintain a healthy weight. For males 20 years and older:  A BMI below 18.5 is considered underweight.  A BMI of 18.5 to 24.9 is normal.  A BMI of 25 to 29.9 is considered overweight.  A BMI of 30 and above is considered obese.  Maintain normal blood lipids and cholesterol by exercising and minimizing your intake of saturated fat. Eat a balanced diet with plenty of fruits and vegetables. Blood tests for lipids and cholesterol should begin at age 47 and be repeated every 5 years. If your lipid or cholesterol levels are high, you are over age 47, or you are at high risk for heart disease, you may need your cholesterol levels checked more frequently.Ongoing high lipid and cholesterol levels should be treated with  medicines if diet and exercise are not working.  If you smoke, find out from your health care provider how to quit. If you do not use tobacco, do not start.  Lung cancer screening is recommended for adults aged 55-80 years who are at high risk for developing lung cancer because of a history of smoking. A yearly low-dose CT scan of the lungs is recommended for people who have at least a 30-pack-year history of smoking and are current smokers or have quit within the past 15 years. A pack year of smoking is smoking an average of 1 pack of cigarettes a day for 1 year (for example, a 30-pack-year history of smoking could mean smoking 1 pack a day for 30 years or 2 packs a day for 15 years). Yearly screening should continue until the smoker has stopped smoking for at least 15 years. Yearly screening should be stopped for people who develop a health problem that would prevent them from having lung cancer treatment.  If you choose to drink alcohol, do not have more than 2 drinks per day. One drink is considered to be 12 oz (360 mL) of beer, 5 oz (150 mL) of wine, or 1.5 oz (45 mL) of liquor.  Avoid the use of street drugs. Do not share needles with anyone. Ask for help if you need support or instructions about stopping the use of drugs.  High blood pressure causes heart disease and increases the risk of stroke. High blood pressure is more likely to develop  in:  People who have blood pressure in the end of the normal range (100-139/85-89 mm Hg).  People who are overweight or obese.  People who are African American.  If you are 76-70 years of age, have your blood pressure checked every 3-5 years. If you are 36 years of age or older, have your blood pressure checked every year. You should have your blood pressure measured twice--once when you are at a hospital or clinic, and once when you are not at a hospital or clinic. Record the average of the two measurements. To check your blood pressure when you are not  at a hospital or clinic, you can use:  An automated blood pressure machine at a pharmacy.  A home blood pressure monitor.  If you are 52-23 years old, ask your health care provider if you should take aspirin to prevent heart disease.  Diabetes screening involves taking a blood sample to check your fasting blood sugar level. This should be done once every 3 years after age 74 if you are at a normal weight and without risk factors for diabetes. Testing should be considered at a younger age or be carried out more frequently if you are overweight and have at least 1 risk factor for diabetes.  Colorectal cancer can be detected and often prevented. Most routine colorectal cancer screening begins at the age of 26 and continues through age 88. However, your health care provider may recommend screening at an earlier age if you have risk factors for colon cancer. On a yearly basis, your health care provider may provide home test kits to check for hidden blood in the stool. A small camera at the end of a tube may be used to directly examine the colon (sigmoidoscopy or colonoscopy) to detect the earliest forms of colorectal cancer. Talk to your health care provider about this at age 5 when routine screening begins. A direct exam of the colon should be repeated every 5-10 years through age 75, unless early forms of precancerous polyps or small growths are found.  People who are at an increased risk for hepatitis B should be screened for this virus. You are considered at high risk for hepatitis B if:  You were born in a country where hepatitis B occurs often. Talk with your health care provider about which countries are considered high risk.  Your parents were born in a high-risk country and you have not received a shot to protect against hepatitis B (hepatitis B vaccine).  You have HIV or AIDS.  You use needles to inject street drugs.  You live with, or have sex with, someone who has hepatitis B.  You  are a man who has sex with other men (MSM).  You get hemodialysis treatment.  You take certain medicines for conditions like cancer, organ transplantation, and autoimmune conditions.  Hepatitis C blood testing is recommended for all people born from [birth year] through 1967 and any individual with known risk factors for hepatitis C.  Healthy men should no longer receive prostate-specific antigen (PSA) blood tests as part of routine cancer screening. Talk to your health care provider about prostate cancer screening.  Testicular cancer screening is not recommended for adolescents or adult males who have no symptoms. Screening includes self-exam, a health care provider exam, and other screening tests. Consult with your health care provider about any symptoms you have or any concerns you have about testicular cancer.  Practice safe sex. Use condoms and avoid high-risk sexual practices to reduce the  spread of sexually transmitted infections (STIs).  You should be screened for STIs, including gonorrhea and chlamydia if:  You are sexually active and are younger than 24 years.  You are older than 24 years, and your health care provider tells you that you are at risk for this type of infection.  Your sexual activity has changed since you were last screened, and you are at an increased risk for chlamydia or gonorrhea. Ask your health care provider if you are at risk.  If you are at risk of being infected with HIV, it is recommended that you take a prescription medicine daily to prevent HIV infection. This is called pre-exposure prophylaxis (PrEP). You are considered at risk if:  You are a man who has sex with other men (MSM).  You are a heterosexual man who is sexually active with multiple partners.  You take drugs by injection.  You are sexually active with a partner who has HIV.  Talk with your health care provider about whether you are at high risk of being infected with HIV. If you choose to begin  PrEP, you should first be tested for HIV. You should then be tested every 3 months for as long as you are taking PrEP.  Use sunscreen. Apply sunscreen liberally and repeatedly throughout the day. You should seek shade when your shadow is shorter than you. Protect yourself by wearing long sleeves, pants, a wide-brimmed hat, and sunglasses year round whenever you are outdoors.  Tell your health care provider of new moles or changes in moles, especially if there is a change in shape or color. Also, tell your health care provider if a mole is larger than the size of a pencil eraser.  A one-time screening for abdominal aortic aneurysm (AAA) and surgical repair of large AAAs by ultrasound is recommended for men aged 60-75 years who are current or former smokers.  Stay current with your vaccines (immunizations).   This information is not intended to replace advice given to you by your health care provider. Make sure you discuss any questions you have with your health care provider.   Document Released: 02/13/2008 Document Revised: 09/07/2014 Document Reviewed: 01/12/2011 Elsevier Interactive Patient Education [employer].

## 2016-08-25 NOTE — Progress Notes (Signed)
Patient presents to clinic today to establish care. He is a pleasant 45 year old male who  has a past medical history of Allergy; GERD (gastroesophageal reflux disease); and Hypertension.  Acute Concerns: Establish Care  Chronic Issues: Hypertension - Well controlled. Does not check at home. Takes medications daily.   GERD - Well controlled with current medications   Health Maintenance: Dental -- Routine  Vision -- Does not do routine care  Immunizations --UTD  Colonoscopy -- Never had Diet: He has a "terrible diet".  Exercise: Does not exercise    Past Medical History:  Diagnosis Date  . Allergy   . GERD (gastroesophageal reflux disease)   . Hypertension     Past Surgical History:  Procedure Laterality Date  . HYDROCELE EXCISION Right 04/11/2013   Procedure: RIGHT HYDROCELECTOMY ADULT;  Surgeon: Lindaann SloughMarc-Henry Nesi, MD;  Location: [facility];  Service: Urology;  Laterality: Right;  . INGUINAL HERNIA REPAIR Right 04/11/2013   Procedure: HERNIA REPAIR INGUINAL ADULT;  Surgeon: Ernestene MentionHaywood M Ingram, MD;  Location: [facility];  Service: General;  Laterality: Right;  Marland Kitchen. VASECTOMY Bilateral 04/11/2013   Procedure: BILATERAL VASECTOMY;  Surgeon: Lindaann SloughMarc-Henry Nesi, MD;  Location: [facility];  Service: Urology;  Laterality: Bilateral;    Current Outpatient Prescriptions on File Prior to Visit  Medication Sig Dispense Refill  . LORazepam (ATIVAN) 0.5 MG tablet Take 1 tablet (0.5 mg total) by mouth as needed for anxiety. 10 tablet 0  . trimethoprim-polymyxin b (POLYTRIM) ophthalmic solution Place 1 drop into the right eye every 4 (four) hours. 10 mL 0   No current facility-administered medications on file prior to visit.     No Known Allergies  Family History  Problem Relation Age of Onset  . Alcohol abuse    . Arthritis    . Heart disease    . Stroke    . Depression    . Diabetes      Social History   Social History  . Marital status: Divorced    Spouse name: N/A  . Number of  children: N/A  . Years of education: N/A   Occupational History  . Not on file.   Social History Main Topics  . Smoking status: Never Smoker  . Smokeless tobacco: Never Used  . Alcohol use No  . Drug use:     Types: Marijuana     Comment: marijuana 2 x daily  . Sexual activity: Yes    Partners: Female   Other Topics Concern  . Not on file   Social History Narrative   He works at [employer]    He is married    Has 6 children - Age 47 to 6        Review of Systems  Constitutional: Negative.   HENT: Negative.   Eyes: Negative.   Respiratory: Negative.   Cardiovascular: Negative.   Gastrointestinal: Negative.   Genitourinary: Negative.   Skin: Negative.   Neurological: Negative.   Endo/Heme/Allergies: Negative.   Psychiatric/Behavioral: Negative.     BP 118/74   Temp 98.2 F (36.8 C) (Oral)   Ht 5\' 7"  (1.702 m)   Wt 171 lb 6 oz (77.7 kg)   BMI 26.84 kg/m   Physical Exam  Constitutional: He is oriented to person, place, and time and well-developed, well-nourished, and in no distress. No distress.  Cardiovascular: Normal rate, regular rhythm, normal heart sounds and intact distal pulses.  Exam reveals no friction rub.   No murmur heard. Pulmonary/Chest:  Effort normal and breath sounds normal. No respiratory distress. He has no wheezes. He has no rales. He exhibits no tenderness.  Neurological: He is alert and oriented to person, place, and time. Gait normal. GCS score is 15.  Skin: Skin is warm and dry. No rash noted. He is not diaphoretic. No erythema. No pallor.  Psychiatric: Mood, memory, affect and judgment normal.  Nursing note and vitals reviewed.   Assessment/Plan:  1. Encounter to establish care - Follow up for CPE - He needs to work on diet and exercise  -   2. Medication refill - amLODipine (NORVASC) 5 MG tablet; Take 1 tablet (5 mg total) by mouth daily.  Dispense: 90 tablet; Refill: 3 - omeprazole (PRILOSEC) 40 MG capsule; Take 1  capsule (40 mg total) by mouth every evening.  Dispense: 90 capsule; Refill: 3 - pravastatin (PRAVACHOL) 20 MG tablet; Take 1 tablet (20 mg total) by mouth daily.  Dispense: 90 tablet; Refill: 3  3. Essential hypertension - Well controlled. No change in medications at this time.  - Will continue to monitor.   Shirline Freesory Ozil Stettler, NP

## 2016-08-25 NOTE — Progress Notes (Signed)
Pre visit review using our clinic review tool, if applicable. No additional management support is needed unless otherwise documented below in the visit note. 

## 2016-09-22 ENCOUNTER — Encounter: Payer: Self-pay | Admitting: Adult Health

## 2016-09-22 ENCOUNTER — Ambulatory Visit (INDEPENDENT_AMBULATORY_CARE_PROVIDER_SITE_OTHER): Payer: BLUE CROSS/BLUE SHIELD | Admitting: Adult Health

## 2016-09-22 VITALS — BP 118/72 | Temp 98.6°F | Ht 67.0 in | Wt 172.6 lb

## 2016-09-22 DIAGNOSIS — R079 Chest pain, unspecified: Secondary | ICD-10-CM

## 2016-09-22 DIAGNOSIS — Z0000 Encounter for general adult medical examination without abnormal findings: Secondary | ICD-10-CM

## 2016-09-22 DIAGNOSIS — I10 Essential (primary) hypertension: Secondary | ICD-10-CM | POA: Diagnosis not present

## 2016-09-22 LAB — POC URINALSYSI DIPSTICK (AUTOMATED)
Blood, UA: NEGATIVE
Glucose, UA: NEGATIVE
Leukocytes, UA: NEGATIVE
Nitrite, UA: NEGATIVE
Spec Grav, UA: >=1.03
Urobilinogen, UA: 0.2
pH, UA: 6

## 2016-09-22 LAB — CBC WITH DIFFERENTIAL/PLATELET
Basophils Absolute: 0 10*3/uL (ref 0.0–0.1)
Basophils Relative: 0.3 % (ref 0.0–3.0)
Eosinophils Absolute: 0.2 10*3/uL (ref 0.0–0.7)
Eosinophils Relative: 2.1 % (ref 0.0–5.0)
HCT: 40.1 % (ref 39.0–52.0)
Hemoglobin: 13.4 g/dL (ref 13.0–17.0)
Lymphocytes Relative: 38.1 % (ref 12.0–46.0)
Lymphs Abs: 3.2 10*3/uL (ref 0.7–4.0)
MCHC: 33.3 g/dL (ref 30.0–36.0)
MCV: 90.5 fl (ref 78.0–100.0)
Monocytes Absolute: 0.5 10*3/uL (ref 0.1–1.0)
Monocytes Relative: 5.5 % (ref 3.0–12.0)
Neutro Abs: 4.6 10*3/uL (ref 1.4–7.7)
Neutrophils Relative %: 54 % (ref 43.0–77.0)
Platelets: 214 10*3/uL (ref 150.0–400.0)
RBC: 4.43 Mil/uL (ref 4.22–5.81)
RDW: 13.6 % (ref 11.5–15.5)
WBC: 8.5 10*3/uL (ref 4.0–10.5)

## 2016-09-22 LAB — LIPID PANEL
Cholesterol: 167 mg/dL (ref 0–200)
HDL: 48.5 mg/dL (ref >39.00)
LDL Cholesterol: 98 mg/dL (ref 0–99)
NonHDL: 118.47
Total CHOL/HDL Ratio: 3
Triglycerides: 104 mg/dL (ref 0.0–149.0)
VLDL: 20.8 mg/dL (ref 0.0–40.0)

## 2016-09-22 LAB — BASIC METABOLIC PANEL
BUN: 15 mg/dL (ref 6–23)
CO2: 32 mEq/L (ref 19–32)
Calcium: 9.7 mg/dL (ref 8.4–10.5)
Chloride: 105 mEq/L (ref 96–112)
Creatinine, Ser: 1.1 mg/dL (ref 0.40–1.50)
GFR: 92.14 mL/min (ref >60.00)
Glucose, Bld: 83 mg/dL (ref 70–99)
Potassium: 4 mEq/L (ref 3.5–5.1)
Sodium: 139 mEq/L (ref 135–145)

## 2016-09-22 LAB — HEPATIC FUNCTION PANEL
ALT: 20 U/L (ref 0–53)
AST: 19 U/L (ref 0–37)
Albumin: 4.4 g/dL (ref 3.5–5.2)
Alkaline Phosphatase: 79 U/L (ref 39–117)
Bilirubin, Direct: 0.2 mg/dL (ref 0.0–0.3)
Total Bilirubin: 0.9 mg/dL (ref 0.2–1.2)
Total Protein: 7.3 g/dL (ref 6.0–8.3)

## 2016-09-22 LAB — TSH: TSH: 0.87 u[IU]/mL (ref 0.35–4.50)

## 2016-09-22 LAB — PSA: PSA: 1.27 ng/mL (ref 0.10–4.00)

## 2016-09-22 NOTE — Progress Notes (Signed)
Subjective:    Patient ID: Marc Hancock, male    DOB: 06-13-1971, 46 y.o.   MRN: [id number]  HPI  Patient presents for yearly preventative medicine examination. He is a pleasant 46 year old male who  has a past medical history of Allergy; GERD (gastroesophageal reflux disease); and Hypertension.  All immunizations and health maintenance protocols were reviewed with the patient and needed orders were placed.  Appropriate screening laboratory values were ordered for the patient including screening of hyperlipidemia, renal function and hepatic function. If indicated by BPH, a PSA was ordered.  Medication reconciliation,  past medical history, social history, problem list and allergies were reviewed in detail with the patient  Goals were established with regard to weight loss, exercise, and  diet in compliance with medications. He leads a sedentary lifestyle outside of work and has a poor diet.   He reports that about 5 days ago he had an "aching chest pain in my left side" with tightness in his left arm and radiating pain to his left jaw. The pain was constant. Applying pressure helped relieve some of the pain He reports that the pain has subsided today. He denies any trauma to the area. He also reports increasing shortness of breath both with exertion and sedentary.    Review of Systems  Constitutional: Negative.   HENT: Negative.   Eyes: Negative.   Respiratory: Negative.   Cardiovascular: Positive for chest pain. Negative for palpitations and leg swelling.  Gastrointestinal: Negative.   Endocrine: Negative.   Genitourinary: Negative.   Musculoskeletal: Negative.   Skin: Negative.   Allergic/Immunologic: Negative.   Neurological: Negative.   Hematological: Negative.   Psychiatric/Behavioral: Negative.   All other systems reviewed and are negative.  Past Medical History:  Diagnosis Date  . Allergy   . GERD (gastroesophageal reflux disease)   . Hypertension     Social  History   Social History  . Marital status: Divorced    Spouse name: N/A  . Number of children: N/A  . Years of education: N/A   Occupational History  . Not on file.   Social History Main Topics  . Smoking status: Never Smoker  . Smokeless tobacco: Never Used  . Alcohol use No  . Drug use: Yes    Types: Marijuana     Comment: marijuana 2 x daily  . Sexual activity: Yes    Partners: Female   Other Topics Concern  . Not on file   Social History Narrative   He works at [employer]    He is married    Has 6 children - Age 39 to 42        Past Surgical History:  Procedure Laterality Date  . HYDROCELE EXCISION Right 04/11/2013   Procedure: RIGHT HYDROCELECTOMY ADULT;  Surgeon: Lindaann Slough, MD;  Location: [facility];  Service: Urology;  Laterality: Right;  . INGUINAL HERNIA REPAIR Right 04/11/2013   Procedure: HERNIA REPAIR INGUINAL ADULT;  Surgeon: Ernestene Mention, MD;  Location: [facility];  Service: General;  Laterality: Right;  Marland Kitchen VASECTOMY Bilateral 04/11/2013   Procedure: BILATERAL VASECTOMY;  Surgeon: Lindaann Slough, MD;  Location: [facility];  Service: Urology;  Laterality: Bilateral;    Family History  Problem Relation Age of Onset  . Alcohol abuse    . Arthritis    . Heart disease    . Stroke    . Depression    . Diabetes      No Known Allergies  Current  Outpatient Prescriptions on File Prior to Visit  Medication Sig Dispense Refill  . amLODipine (NORVASC) 5 MG tablet Take 1 tablet (5 mg total) by mouth daily. 90 tablet 3  . aspirin EC 81 MG tablet Take 1 tablet (81 mg total) by mouth every evening. 90 tablet 3  . LORazepam (ATIVAN) 0.5 MG tablet Take 1 tablet (0.5 mg total) by mouth as needed for anxiety. 10 tablet 0  . losartan (COZAAR) 100 MG tablet Take 1 tablet (100 mg total) by mouth daily. 90 tablet 3  . omeprazole (PRILOSEC) 40 MG capsule Take 1 capsule (40 mg total) by mouth every evening. 90 capsule 3  . pravastatin (PRAVACHOL) 20 MG tablet Take 1  tablet (20 mg total) by mouth daily. 90 tablet 3  . trimethoprim-polymyxin b (POLYTRIM) ophthalmic solution Place 1 drop into the right eye every 4 (four) hours. 10 mL 0   No current facility-administered medications on file prior to visit.     BP 118/72   Temp 98.6 F (37 C) (Oral)   Ht 5\' 7"  (1.702 m)   Wt 172 lb 9.6 oz (78.3 kg)   BMI 27.03 kg/m       Objective:   Physical Exam  Constitutional: He is oriented to person, place, and time. He appears well-developed and well-nourished. No distress.  HENT:  Head: Normocephalic and atraumatic.  Right Ear: External ear normal.  Left Ear: External ear normal.  Nose: Nose normal.  Mouth/Throat: Oropharynx is clear and moist. No oropharyngeal exudate.  Eyes: Conjunctivae and EOM are normal. Pupils are equal, round, and reactive to light. Right eye exhibits no discharge. Left eye exhibits no discharge. No scleral icterus.  Neck: Normal range of motion. Neck supple. No JVD present. Carotid bruit is not present. No tracheal deviation present. No thyromegaly present.  Cardiovascular: Normal rate, regular rhythm, normal heart sounds and intact distal pulses.  Exam reveals no gallop and no friction rub.   No murmur heard. Pulmonary/Chest: Effort normal and breath sounds normal. No stridor. No respiratory distress. He has no wheezes. He has no rales. He exhibits no tenderness.  Abdominal: Soft. Bowel sounds are normal. He exhibits no distension and no mass. There is no tenderness. There is no rebound and no guarding.  Genitourinary: Rectum normal and prostate normal. Rectal exam shows guaiac negative stool.  Musculoskeletal: Normal range of motion. He exhibits no edema, tenderness or deformity.  Not able to reproduce pain with palpation   Lymphadenopathy:    He has no cervical adenopathy.  Neurological: He is alert and oriented to person, place, and time. He has normal reflexes. He displays normal reflexes. No cranial nerve deficit. He exhibits  normal muscle tone. Coordination normal.  Skin: Skin is warm and dry. No rash noted. He is not diaphoretic. No erythema. No pallor.  Psychiatric: He has a normal mood and affect. His behavior is normal. Judgment and thought content normal.  Nursing note and vitals reviewed.     Assessment & Plan:  1. Routine general medical examination at a health care facility - Educated on the importance of a heart healthy diet and frequent exercise.  - Basic metabolic panel - CBC with Differential/Platelet - Hepatic function panel - Lipid panel - POCT Urinalysis Dipstick (Automated) - TSH - PSA - Follow-up in one year or sooner if needed  2. Essential hypertension - Well controlled.  - No change in medications - Basic metabolic panel - CBC with Differential/Platelet - Hepatic function panel - Lipid panel -  POCT Urinalysis Dipstick (Automated) - TSH - PSA  3. Chest pain, unspecified type  - EKG 12-Lead- NSR, rate 61 - Basic metabolic panel - CBC with Differential/Platelet - Hepatic function panel - Lipid panel - POCT Urinalysis Dipstick (Automated) - TSH - PSA - Ambulatory referral to Cardiology - Continue with daily ASA - go to the ER with any continuing chest pain  Shirline Frees, NP

## 2016-10-09 ENCOUNTER — Ambulatory Visit: Payer: BLUE CROSS/BLUE SHIELD | Admitting: Cardiology

## 2016-10-09 NOTE — Progress Notes (Deleted)
Cardiology Office Note    Date:  10/09/2016   ID:  Marc MurphyMarlon Heckert, DOB 1970/11/22, MRN [id number]  PCP:  Shirline Freesory Nafziger, NP  Cardiologist:   Donato SchultzMark Obaloluwa Delatte, MD     History of Present Illness:  Marc Hancock is a 46 y.o. male here for the evaluation of chest discomfort at the request Jake SamplesofCory Nafziger, NP. In review of his prior note from 09/22/16, he reported an aching chest pain left-sided with tightness left arm and radiation to his left jaw. It was constant. Pressure relief some of the pain when pushing on his chest. No trauma. He noted some increased shortness of breath with activity. He also noted this at rest.    Past Medical History:  Diagnosis Date  . Allergy   . GERD (gastroesophageal reflux disease)   . Hypertension     Past Surgical History:  Procedure Laterality Date  . HYDROCELE EXCISION Right 04/11/2013   Procedure: RIGHT HYDROCELECTOMY ADULT;  Surgeon: Lindaann SloughMarc-Henry Nesi, MD;  Location: [facility];  Service: Urology;  Laterality: Right;  . INGUINAL HERNIA REPAIR Right 04/11/2013   Procedure: HERNIA REPAIR INGUINAL ADULT;  Surgeon: Ernestene MentionHaywood M Ingram, MD;  Location: [facility];  Service: General;  Laterality: Right;  Marland Kitchen. VASECTOMY Bilateral 04/11/2013   Procedure: BILATERAL VASECTOMY;  Surgeon: Lindaann SloughMarc-Henry Nesi, MD;  Location: [facility];  Service: Urology;  Laterality: Bilateral;    Current Medications: Outpatient Medications Prior to Visit  Medication Sig Dispense Refill  . amLODipine (NORVASC) 5 MG tablet Take 1 tablet (5 mg total) by mouth daily. 90 tablet 3  . aspirin EC 81 MG tablet Take 1 tablet (81 mg total) by mouth every evening. 90 tablet 3  . LORazepam (ATIVAN) 0.5 MG tablet Take 1 tablet (0.5 mg total) by mouth as needed for anxiety. 10 tablet 0  . losartan (COZAAR) 100 MG tablet Take 1 tablet (100 mg total) by mouth daily. 90 tablet 3  . omeprazole (PRILOSEC) 40 MG capsule Take 1 capsule (40 mg total) by mouth every evening. 90 capsule 3  . pravastatin (PRAVACHOL) 20 MG tablet  Take 1 tablet (20 mg total) by mouth daily. 90 tablet 3  . trimethoprim-polymyxin b (POLYTRIM) ophthalmic solution Place 1 drop into the right eye every 4 (four) hours. 10 mL 0   No facility-administered medications prior to visit.      Allergies:   Patient has no known allergies.   Social History   Social History  . Marital status: Divorced    Spouse name: N/A  . Number of children: N/A  . Years of education: N/A   Social History Main Topics  . Smoking status: Never Smoker  . Smokeless tobacco: Never Used  . Alcohol use No  . Drug use: Yes    Types: Marijuana     Comment: marijuana 2 x daily  . Sexual activity: Yes    Partners: Female   Other Topics Concern  . Not on file   Social History Narrative   He works at [employer]    He is married    Has 6 children - Age 48 to 576         Family History:  The patientHas no early family history of coronary artery disease   ROS:   Please see the history of present illness.    ROS All other systems reviewed and are negative.   PHYSICAL EXAM:   VS:  There were no vitals taken for this visit.   GEN: Well nourished, well  developed, in no acute distress  HEENT: normal  Neck: no JVD, carotid bruits, or masses Cardiac: RRR; no murmurs, rubs, or gallops,no edema  Respiratory:  clear to auscultation bilaterally, normal work of breathing GI: soft, nontender, nondistended, + BS MS: no deformity or atrophy  Skin: warm and dry, no rash Neuro:  Alert and Oriented x 3, Strength and sensation are intact Psych: euthymic mood, full affect  Wt Readings from Last 3 Encounters:  09/22/16 172 lb 9.6 oz (78.3 kg)  08/25/16 171 lb 6 oz (77.7 kg)  05/23/15 169 lb 6.4 oz (76.8 kg)      Studies/Labs Reviewed:   EKG:  .  Prior EKG from 09/22/16 shows sinus rhythm with no other abnormalities.  Recent Labs: 09/22/2016: ALT 20; BUN 15; Creatinine, Ser 1.10; Hemoglobin 13.4; Platelets 214.0; Potassium 4.0; Sodium 139; TSH 0.87    Lipid Panel    Component Value Date/Time   CHOL 167 09/22/2016 1106   TRIG 104.0 09/22/2016 1106   HDL 48.50 09/22/2016 1106   CHOLHDL 3 09/22/2016 1106   VLDL 20.8 09/22/2016 1106   LDLCALC 98 09/22/2016 1106    Additional studies/ records that were reviewed today include:  Labwork reviewed, EKG reviewed, prior office notes reviewed.    ASSESSMENT:    No diagnosis found.   PLAN:  In order of problems listed above:  Chest pain     Medication Adjustments/Labs and Tests Ordered: Current medicines are reviewed at length with the patient today.  Concerns regarding medicines are outlined above.  Medication changes, Labs and Tests ordered today are listed in the Patient Instructions below. There are no Patient Instructions on file for this visit.   Signed, Donato Schultz, MD  10/09/2016 3:23 PM    [facility] [address] [place], [place], [place]  [address] Phone: [phone]; Fax: [phone]

## 2016-10-14 ENCOUNTER — Encounter: Payer: Self-pay | Admitting: Cardiology

## 2017-05-17 ENCOUNTER — Ambulatory Visit (INDEPENDENT_AMBULATORY_CARE_PROVIDER_SITE_OTHER): Payer: BLUE CROSS/BLUE SHIELD | Admitting: Adult Health

## 2017-05-17 ENCOUNTER — Encounter: Payer: Self-pay | Admitting: Adult Health

## 2017-05-17 VITALS — BP 130/80 | Temp 98.4°F | Wt 167.0 lb

## 2017-05-17 DIAGNOSIS — R519 Headache, unspecified: Secondary | ICD-10-CM

## 2017-05-17 DIAGNOSIS — K2100 Gastro-esophageal reflux disease with esophagitis, without bleeding: Secondary | ICD-10-CM

## 2017-05-17 DIAGNOSIS — R51 Headache: Secondary | ICD-10-CM

## 2017-05-17 NOTE — Progress Notes (Signed)
Subjective:    Patient ID: Marc Hancock, male    DOB: 1971/08/02, 46 y.o.   MRN: [id number]  HPI  46 year old male who  has a past medical history of Allergy; GERD (gastroesophageal reflux disease); and Hypertension. He presents to the office today with multiple acute complaints.   1. Over the last 2-3 months he has been experiencing bloating, nausea, heart burn and waking up with a sour taste in his mouth. He is currently prescribed prilosec 40 mg and has been finding himself taking this medication BID to help with his symptoms. He denies any diarrhea, constipation, or vomiting. He does report episodes of abdominal pain ( epigastric). His diet is poor, he does not eat vegetables and eat out a lot, which is usually fried foods/fast foods. He does not stay hydrated at work.    2. For the last 3 weeks he has had episodes of headaches. He will get 2-3 headaches a week. Cannot describe the pain or where the headache is located. It is not located behind his eyes. Denies any blurred vision. He does confess on not forgetting to take his blood pressure medication during a few of these headaches.  Last headache was 3 days ago   Review of Systems See HPI   Past Medical History:  Diagnosis Date  . Allergy   . GERD (gastroesophageal reflux disease)   . Hypertension     Social History   Social History  . Marital status: Divorced    Spouse name: N/A  . Number of children: N/A  . Years of education: N/A   Occupational History  . Not on file.   Social History Main Topics  . Smoking status: Never Smoker  . Smokeless tobacco: Never Used  . Alcohol use No  . Drug use: Yes    Types: Marijuana     Comment: marijuana 2 x daily  . Sexual activity: Yes    Partners: Female   Other Topics Concern  . Not on file   Social History Narrative   He works at [employer]    He is married    Has 6 children - Age 56 to 74        Past Surgical History:  Procedure Laterality Date  .  HYDROCELE EXCISION Right 04/11/2013   Procedure: RIGHT HYDROCELECTOMY ADULT;  Surgeon: Lindaann Slough, MD;  Location: [facility];  Service: Urology;  Laterality: Right;  . INGUINAL HERNIA REPAIR Right 04/11/2013   Procedure: HERNIA REPAIR INGUINAL ADULT;  Surgeon: Ernestene Mention, MD;  Location: [facility];  Service: General;  Laterality: Right;  Marland Kitchen VASECTOMY Bilateral 04/11/2013   Procedure: BILATERAL VASECTOMY;  Surgeon: Lindaann Slough, MD;  Location: [facility];  Service: Urology;  Laterality: Bilateral;    Family History  Problem Relation Age of Onset  . Alcohol abuse Unknown   . Arthritis Unknown   . Heart disease Unknown   . Stroke Unknown   . Depression Unknown   . Diabetes Unknown     No Known Allergies  Current Outpatient Prescriptions on File Prior to Visit  Medication Sig Dispense Refill  . amLODipine (NORVASC) 5 MG tablet Take 1 tablet (5 mg total) by mouth daily. 90 tablet 3  . aspirin EC 81 MG tablet Take 1 tablet (81 mg total) by mouth every evening. 90 tablet 3  . losartan (COZAAR) 100 MG tablet Take 1 tablet (100 mg total) by mouth daily. 90 tablet 3  . omeprazole (PRILOSEC) 40 MG capsule Take  1 capsule (40 mg total) by mouth every evening. 90 capsule 3  . pravastatin (PRAVACHOL) 20 MG tablet Take 1 tablet (20 mg total) by mouth daily. 90 tablet 3   No current facility-administered medications on file prior to visit.     BP 130/80 (BP Location: Right Arm)   Temp 98.4 F (36.9 C) (Oral)   Wt 167 lb (75.8 kg)   BMI 26.16 kg/m       Objective:   Physical Exam  Constitutional: He is oriented to person, place, and time. He appears well-developed and well-nourished. No distress.  HENT:  Head: Normocephalic and atraumatic.  Right Ear: Hearing, tympanic membrane, external ear and ear canal normal.  Left Ear: Hearing, tympanic membrane, external ear and ear canal normal.  Nose: Nose normal. No mucosal edema or rhinorrhea.  Mouth/Throat: Uvula is midline, oropharynx is clear  and moist and mucous membranes are normal.  Eyes: Pupils are equal, round, and reactive to light. Conjunctivae and EOM are normal. Left eye exhibits no discharge. No scleral icterus.  Cardiovascular: Normal rate, regular rhythm, normal heart sounds and intact distal pulses.  Exam reveals no gallop.   No murmur heard. Pulmonary/Chest: Effort normal and breath sounds normal. No respiratory distress. He has no wheezes. He has no rales. He exhibits no tenderness.  Abdominal: Normal appearance. There is no hepatosplenomegaly, splenomegaly or hepatomegaly. There is tenderness in the epigastric area. There is no CVA tenderness.  Neurological: He is alert and oriented to person, place, and time.  Skin: Skin is warm and dry. No rash noted. He is not diaphoretic. No erythema. No pallor.  Psychiatric: He has a normal mood and affect. His behavior is normal. Judgment and thought content normal.  Nursing note and vitals reviewed.     Assessment & Plan:  We spoke about diet and medication changes for GERD. He is currently taking Prilosec 40 mg. I would change him to Protonix 40 mg daily. I would like him to work on diet for two weeks. He was given the challenge of not eating any fast food or fried foods for two weeks. He is to drink more water throughout the day. He is ok with trying this before doing any medication changes. He understands that he needs to eat healthier and stay hydrated, especially while at work. Advised to follow up up in 2 weeks or sooner if needed - Educated on the importance of taking medications at the same time every day, as this is what is probably causing his headaches.    Shirline Frees, NP

## 2017-06-08 DIAGNOSIS — Z23 Encounter for immunization: Secondary | ICD-10-CM | POA: Diagnosis not present

## 2017-08-12 ENCOUNTER — Other Ambulatory Visit: Payer: Self-pay | Admitting: Adult Health

## 2017-08-12 DIAGNOSIS — Z760 Encounter for issue of repeat prescription: Secondary | ICD-10-CM

## 2017-08-12 NOTE — Telephone Encounter (Signed)
Sent to the pharmacy by e-scribe for 60 days.  Pt due for cpx on or after 09/22/17.

## 2017-09-14 ENCOUNTER — Other Ambulatory Visit: Payer: Self-pay | Admitting: Adult Health

## 2017-09-14 DIAGNOSIS — Z760 Encounter for issue of repeat prescription: Secondary | ICD-10-CM

## 2017-09-15 NOTE — Telephone Encounter (Signed)
  Assessment & Plan:  We spoke about diet and medication changes for GERD. He is currently taking Prilosec 40 mg. I would change him to Protonix 40 mg daily. I would like him to work on diet for two weeks. He was given the challenge of not eating any fast food or fried foods for two weeks. He is to drink more water throughout the day. He is ok with trying this before doing any medication changes. He understands that he needs to eat healthier and stay hydrated, especially while at work. Advised to follow up up in 2 weeks or sooner if needed - Educated on the importance of taking medications at the same time every day, as this is what is probably causing his headaches.    Shirline Freesory Nafziger, NP   Kandee Keenory, I do not see that Prilosec has been sent to the pharmacy.  Please advise.

## 2017-09-15 NOTE — Telephone Encounter (Signed)
To send in Protonix 40 mg daily

## 2017-09-16 MED ORDER — PANTOPRAZOLE SODIUM 40 MG PO TBEC: 40.0000 mg | DELAYED_RELEASE_TABLET | Freq: Every day | ORAL | 0 refills | Status: DC

## 2017-09-16 NOTE — Telephone Encounter (Signed)
Protonix sent to the pharmacy.  Pt notified he is now due for cpx.  Pt states he will check his schedule and call back.

## 2017-09-18 ENCOUNTER — Other Ambulatory Visit: Payer: Self-pay | Admitting: Adult Health

## 2017-09-18 DIAGNOSIS — Z760 Encounter for issue of repeat prescription: Secondary | ICD-10-CM

## 2017-09-22 NOTE — Telephone Encounter (Signed)
THIS MEDICATION HAS BEEN DISCONTINUED. 

## 2017-10-02 ENCOUNTER — Other Ambulatory Visit: Payer: Self-pay | Admitting: Adult Health

## 2017-10-02 DIAGNOSIS — Z760 Encounter for issue of repeat prescription: Secondary | ICD-10-CM

## 2017-10-05 NOTE — Telephone Encounter (Signed)
Sent to the pharmacy by e-scribe for 60 days.  Spoke to the pt and scheduled him for cpx on 11/10/17 @ 8 AM.

## 2017-10-14 ENCOUNTER — Other Ambulatory Visit: Payer: Self-pay | Admitting: Adult Health

## 2017-10-14 DIAGNOSIS — Z760 Encounter for issue of repeat prescription: Secondary | ICD-10-CM

## 2017-10-14 NOTE — Telephone Encounter (Signed)
Sent to the pharmacy by e-scribe for 30 days.  Pt has cpx scheduled on 11/10/17.

## 2017-11-03 ENCOUNTER — Other Ambulatory Visit: Payer: Self-pay | Admitting: Adult Health

## 2017-11-03 NOTE — Telephone Encounter (Signed)
Assessment & Plan:  1. Routine general medical examination at a health care facility - Educated on the importance of a heart healthy diet and frequent exercise.  - Basic metabolic panel - CBC with Differential/Platelet - Hepatic function panel - Lipid panel - POCT Urinalysis Dipstick (Automated) - TSH - PSA - Follow-up in one year or sooner if needed  2. Essential hypertension - Well controlled.  - No change in medications - Basic metabolic panel - CBC with Differential/Platelet - Hepatic function panel - Lipid panel - POCT Urinalysis Dipstick (Automated) - TSH - PSA  3. Chest pain, unspecified type  - EKG 12-Lead- NSR, rate 61 - Basic metabolic panel - CBC with Differential/Platelet - Hepatic function panel - Lipid panel - POCT Urinalysis Dipstick (Automated) - TSH - PSA - Ambulatory referral to Cardiology - Continue with daily ASA - go to the ER with any continuing chest pain  Shirline Freesory Nafziger, NP     Taken from OV on 09/22/16.  Pt to continue with daily ASA.  #60 sent to the pharmacy by e-scribe.

## 2017-11-10 ENCOUNTER — Ambulatory Visit (INDEPENDENT_AMBULATORY_CARE_PROVIDER_SITE_OTHER): Payer: BLUE CROSS/BLUE SHIELD | Admitting: Adult Health

## 2017-11-10 ENCOUNTER — Encounter: Payer: Self-pay | Admitting: Adult Health

## 2017-11-10 VITALS — BP 138/80 | Temp 98.7°F | Ht 66.0 in | Wt 161.0 lb

## 2017-11-10 DIAGNOSIS — K2100 Gastro-esophageal reflux disease with esophagitis, without bleeding: Secondary | ICD-10-CM

## 2017-11-10 DIAGNOSIS — Z760 Encounter for issue of repeat prescription: Secondary | ICD-10-CM | POA: Diagnosis not present

## 2017-11-10 DIAGNOSIS — Z125 Encounter for screening for malignant neoplasm of prostate: Secondary | ICD-10-CM

## 2017-11-10 DIAGNOSIS — Z114 Encounter for screening for human immunodeficiency virus [HIV]: Secondary | ICD-10-CM

## 2017-11-10 DIAGNOSIS — Z23 Encounter for immunization: Secondary | ICD-10-CM

## 2017-11-10 DIAGNOSIS — E782 Mixed hyperlipidemia: Secondary | ICD-10-CM | POA: Diagnosis not present

## 2017-11-10 DIAGNOSIS — Z0000 Encounter for general adult medical examination without abnormal findings: Secondary | ICD-10-CM

## 2017-11-10 DIAGNOSIS — I10 Essential (primary) hypertension: Secondary | ICD-10-CM

## 2017-11-10 DIAGNOSIS — Z566 Other physical and mental strain related to work: Secondary | ICD-10-CM | POA: Diagnosis not present

## 2017-11-10 LAB — COMPREHENSIVE METABOLIC PANEL
ALT: 24 U/L (ref 0–53)
AST: 22 U/L (ref 0–37)
Albumin: 4.3 g/dL (ref 3.5–5.2)
Alkaline Phosphatase: 89 U/L (ref 39–117)
BUN: 13 mg/dL (ref 6–23)
CO2: 30 mEq/L (ref 19–32)
Calcium: 9.8 mg/dL (ref 8.4–10.5)
Chloride: 103 mEq/L (ref 96–112)
Creatinine, Ser: 0.96 mg/dL (ref 0.40–1.50)
GFR: 107.3 mL/min (ref >60.00)
Glucose, Bld: 89 mg/dL (ref 70–99)
Potassium: 3.6 mEq/L (ref 3.5–5.1)
Sodium: 140 mEq/L (ref 135–145)
Total Bilirubin: 1.1 mg/dL (ref 0.2–1.2)
Total Protein: 7.2 g/dL (ref 6.0–8.3)

## 2017-11-10 LAB — LIPID PANEL
Cholesterol: 145 mg/dL (ref 0–200)
HDL: 46.7 mg/dL (ref >39.00)
LDL Cholesterol: 80 mg/dL (ref 0–99)
NonHDL: 98.12
Total CHOL/HDL Ratio: 3
Triglycerides: 90 mg/dL (ref 0.0–149.0)
VLDL: 18 mg/dL (ref 0.0–40.0)

## 2017-11-10 LAB — CBC WITH DIFFERENTIAL/PLATELET
Basophils Absolute: 0 10*3/uL (ref 0.0–0.1)
Basophils Relative: 0.5 % (ref 0.0–3.0)
Eosinophils Absolute: 0.1 10*3/uL (ref 0.0–0.7)
Eosinophils Relative: 1.5 % (ref 0.0–5.0)
HCT: 38.8 % — ABNORMAL LOW (ref 39.0–52.0)
Hemoglobin: 12.9 g/dL — ABNORMAL LOW (ref 13.0–17.0)
Lymphocytes Relative: 31.8 % (ref 12.0–46.0)
Lymphs Abs: 2.4 10*3/uL (ref 0.7–4.0)
MCHC: 33.4 g/dL (ref 30.0–36.0)
MCV: 91.6 fl (ref 78.0–100.0)
Monocytes Absolute: 0.5 10*3/uL (ref 0.1–1.0)
Monocytes Relative: 7.1 % (ref 3.0–12.0)
Neutro Abs: 4.5 10*3/uL (ref 1.4–7.7)
Neutrophils Relative %: 59.1 % (ref 43.0–77.0)
Platelets: 202 10*3/uL (ref 150.0–400.0)
RBC: 4.23 Mil/uL (ref 4.22–5.81)
RDW: 13.7 % (ref 11.5–15.5)
WBC: 7.6 10*3/uL (ref 4.0–10.5)

## 2017-11-10 LAB — PSA: PSA: 1.32 ng/mL (ref 0.10–4.00)

## 2017-11-10 LAB — TSH: TSH: 1.33 u[IU]/mL (ref 0.35–4.50)

## 2017-11-10 MED ORDER — AMLODIPINE BESYLATE 5 MG PO TABS: 5.0000 mg | ORAL_TABLET | Freq: Every day | ORAL | 3 refills | Status: DC

## 2017-11-10 MED ORDER — PRAVASTATIN SODIUM 20 MG PO TABS: 20.0000 mg | ORAL_TABLET | Freq: Every day | ORAL | 3 refills | Status: DC

## 2017-11-10 MED ORDER — LOSARTAN POTASSIUM 100 MG PO TABS: 100.0000 mg | ORAL_TABLET | Freq: Every day | ORAL | 3 refills | Status: DC

## 2017-11-10 MED ORDER — PANTOPRAZOLE SODIUM 40 MG PO TBEC: 40.0000 mg | DELAYED_RELEASE_TABLET | Freq: Every day | ORAL | 3 refills | Status: DC

## 2017-11-10 NOTE — Progress Notes (Signed)
Subjective:    Patient ID: Marc Hancock, male    DOB: 04/12/1971, 47 y.o.   MRN: [id number]  HPI  Patient presents for yearly preventative medicine examination. He is a pleasant 47 year old male who  has a past medical history of Allergy, GERD (gastroesophageal reflux disease), and Hypertension.  Due to history of hypertension he takes Norvasc 5 mg and losartan 100 mg daily. BP Readings from Last 3 Encounters:  11/10/17 138/80  05/17/17 130/80  09/22/16 118/72   He takes pravastatin 20 mg due to history of hyperlipidemia Lab Results  Component Value Date   CHOL 167 09/22/2016   HDL 48.50 09/22/2016   LDLCALC 98 09/22/2016   TRIG 104.0 09/22/2016   CHOLHDL 3 09/22/2016   He also takes Protonix for GERD-like symptoms  All immunizations and health maintenance protocols were reviewed with the patient and needed orders were placed.  Appropriate screening laboratory values were ordered for the patient including screening of hyperlipidemia, renal function and hepatic function. If indicated by BPH, a PSA was ordered.  Medication reconciliation,  past medical history, social history, problem list and allergies were reviewed in detail with the patient  Goals were established with regard to weight loss, exercise, and  diet in compliance with medications. He has improved his diet and is not eating out any longer. Does not exercise on a regular basis.   Wt Readings from Last 3 Encounters:  11/10/17 161 lb (73 kg)  05/17/17 167 lb (75.8 kg)  09/22/16 172 lb 9.6 oz (78.3 kg)   His biggest complaint is that of feeling stressed over work. He feels as though the stress is starting to take a toll on his relationships and he is starting to have more anger in his life. He will often wake up after 4-5 hours of sleep and is unable to fall back to sleep due to racing thoughts. He is interested in seeing someone to talk to about this. He does not want to go on medication   Review of Systems    Constitutional: Negative.   HENT: Negative.   Eyes: Negative.   Respiratory: Negative.   Cardiovascular: Negative.   Gastrointestinal: Negative.   Endocrine: Negative.   Genitourinary: Negative.   Musculoskeletal: Negative.   Skin: Negative.   Allergic/Immunologic: Negative.   Neurological: Negative.   Hematological: Negative.   Psychiatric/Behavioral: Positive for agitation.  All other systems reviewed and are negative.  Past Medical History:  Diagnosis Date  . Allergy   . GERD (gastroesophageal reflux disease)   . Hypertension     Social History   Socioeconomic History  . Marital status: Divorced    Spouse name: Not on file  . Number of children: Not on file  . Years of education: Not on file  . Highest education level: Not on file  Social Needs  . Financial resource strain: Not on file  . Food insecurity - worry: Not on file  . Food insecurity - inability: Not on file  . Transportation needs - medical: Not on file  . Transportation needs - non-medical: Not on file  Occupational History  . Not on file  Tobacco Use  . Smoking status: Never Smoker  . Smokeless tobacco: Never Used  Substance and Sexual Activity  . Alcohol use: No  . Drug use: Yes    Types: Marijuana    Comment: marijuana 2 x daily  . Sexual activity: Yes    Partners: Female  Other Topics Concern  . Not on  file  Social History Narrative   He works at [employer]    He is married    Has 6 children - Age 49 to 36     Past Surgical History:  Procedure Laterality Date  . HYDROCELE EXCISION Right 04/11/2013   Procedure: RIGHT HYDROCELECTOMY ADULT;  Surgeon: Lindaann SloughMarc-Henry Nesi, MD;  Location: [facility];  Service: Urology;  Laterality: Right;  . INGUINAL HERNIA REPAIR Right 04/11/2013   Procedure: HERNIA REPAIR INGUINAL ADULT;  Surgeon: Ernestene MentionHaywood M Ingram, MD;  Location: [facility];  Service: General;  Laterality: Right;  Marland Kitchen. VASECTOMY Bilateral 04/11/2013   Procedure: BILATERAL VASECTOMY;  Surgeon:  Lindaann SloughMarc-Henry Nesi, MD;  Location: [facility];  Service: Urology;  Laterality: Bilateral;    Family History  Problem Relation Age of Onset  . Alcohol abuse Unknown   . Arthritis Unknown   . Heart disease Unknown   . Stroke Unknown   . Depression Unknown   . Diabetes Unknown     No Known Allergies  Current Outpatient Medications on File Prior to Visit  Medication Sig Dispense Refill  . amLODipine (NORVASC) 5 MG tablet TAKE 1 TABLET BY MOUTH EVERY DAY 60 tablet 0  . aspirin 81 MG EC tablet TAKE 1 TABLET BY MOUTH EVERY DAY IN THE EVENING 60 tablet 0  . losartan (COZAAR) 100 MG tablet TAKE 1 TABLET BY MOUTH EVERY DAY 60 tablet 0  . pantoprazole (PROTONIX) 40 MG tablet Take 1 tablet (40 mg total) by mouth daily. 90 tablet 0  . pravastatin (PRAVACHOL) 20 MG tablet TAKE 1 TABLET BY MOUTH EVERY DAY 60 tablet 0   No current facility-administered medications on file prior to visit.     BP 138/80 (BP Location: Left Arm, Patient Position: Sitting, Cuff Size: Large)   Temp 98.7 F (37.1 C) (Oral)   Ht 5\' 6"  (1.676 m)   Wt 161 lb (73 kg)   BMI 25.99 kg/m       Objective:   Physical Exam  Constitutional: He is oriented to person, place, and time. He appears well-developed and well-nourished. No distress.  HENT:  Head: Normocephalic and atraumatic.  Right Ear: External ear normal.  Left Ear: External ear normal.  Nose: Nose normal.  Mouth/Throat: Oropharynx is clear and moist. No oropharyngeal exudate.  Eyes: Conjunctivae and EOM are normal. Pupils are equal, round, and reactive to light. Right eye exhibits no discharge. Left eye exhibits no discharge. No scleral icterus.  Neck: Normal range of motion. Neck supple. No JVD present. No tracheal deviation present. No thyromegaly present.  Cardiovascular: Normal rate, regular rhythm, normal heart sounds and intact distal pulses. Exam reveals no gallop and no friction rub.  No murmur heard. Pulmonary/Chest: Effort normal and breath sounds  normal. No stridor. No respiratory distress. He has no wheezes. He has no rales. He exhibits no tenderness.  Abdominal: Soft. Bowel sounds are normal. He exhibits no distension and no mass. There is no tenderness. There is no rebound and no guarding.  Musculoskeletal: Normal range of motion. He exhibits no edema, tenderness or deformity.  Lymphadenopathy:    He has no cervical adenopathy.  Neurological: He is alert and oriented to person, place, and time. He has normal reflexes. He displays normal reflexes. No cranial nerve deficit. He exhibits normal muscle tone. Coordination normal.  Skin: Skin is warm and dry. No rash noted. He is not diaphoretic. No erythema. No pallor.  Psychiatric: He has a normal mood and affect. His behavior is normal. Judgment and  thought content normal.  Nursing note and vitals reviewed.      Assessment & Plan:  1. Routine general medical examination at a health care facility - Follow up in one year or sooner if needed - Encouraged diet and exercise  - CBC with Differential/Platelet - Comprehensive metabolic panel - Lipid panel - TSH  2. Essential hypertension - Near goal. Encouraged diet and exercise  - losartan (COZAAR) 100 MG tablet; Take 1 tablet (100 mg total) by mouth daily.  Dispense: 90 tablet; Refill: 3 - amLODipine (NORVASC) 5 MG tablet; Take 1 tablet (5 mg total) by mouth daily.  Dispense: 90 tablet; Refill: 3 - CBC with Differential/Platelet - Comprehensive metabolic panel - Lipid panel - TSH  3. Medication refill  - losartan (COZAAR) 100 MG tablet; Take 1 tablet (100 mg total) by mouth daily.  Dispense: 90 tablet; Refill: 3 - amLODipine (NORVASC) 5 MG tablet; Take 1 tablet (5 mg total) by mouth daily.  Dispense: 90 tablet; Refill: 3 - pravastatin (PRAVACHOL) 20 MG tablet; Take 1 tablet (20 mg total) by mouth daily.  Dispense: 90 tablet; Refill: 3 - pantoprazole (PROTONIX) 40 MG tablet; Take 1 tablet (40 mg total) by mouth daily.  Dispense: 90  tablet; Refill: 3  4. Encounter for screening for HIV  - HIV antibody  5. Gastroesophageal reflux disease with esophagitis  - pantoprazole (PROTONIX) 40 MG tablet; Take 1 tablet (40 mg total) by mouth daily.  Dispense: 90 tablet; Refill: 3  6. Mixed hyperlipidemia - Consider increase in Pravastatin  - pravastatin (PRAVACHOL) 20 MG tablet; Take 1 tablet (20 mg total) by mouth daily.  Dispense: 90 tablet; Refill: 3 - CBC with Differential/Platelet - Comprehensive metabolic panel - Lipid panel - TSH  7. Prostate cancer screening  - PSA  8. Stress at work  - Ambulatory referral to Psychology - Exercise will help with his stress reduction   9. Need for Tdap vaccination  - Tdap vaccine greater than or equal to 7yo IM   Shirline Frees, NP

## 2017-11-10 NOTE — Patient Instructions (Signed)
It was great seeing you today   I will follow up with you regarding your lab work   Work on increasing exercise into your daily regimen   Follow up in one year or sooner if needed

## 2017-11-11 LAB — HIV ANTIBODY (ROUTINE TESTING W REFLEX): HIV 1&2 Ab, 4th Generation: NONREACTIVE

## 2018-02-28 ENCOUNTER — Ambulatory Visit: Payer: Self-pay | Admitting: Psychology

## 2018-03-23 ENCOUNTER — Other Ambulatory Visit: Payer: Self-pay | Admitting: Adult Health

## 2018-03-23 DIAGNOSIS — Z760 Encounter for issue of repeat prescription: Secondary | ICD-10-CM

## 2018-03-23 NOTE — Telephone Encounter (Signed)
Pt is not taking this medication.  Request denied.

## 2018-06-07 DIAGNOSIS — Z23 Encounter for immunization: Secondary | ICD-10-CM | POA: Diagnosis not present

## 2018-11-18 ENCOUNTER — Ambulatory Visit: Payer: Self-pay | Admitting: Internal Medicine

## 2018-11-21 ENCOUNTER — Other Ambulatory Visit: Payer: Self-pay | Admitting: Adult Health

## 2018-11-21 DIAGNOSIS — Z760 Encounter for issue of repeat prescription: Secondary | ICD-10-CM

## 2018-11-21 DIAGNOSIS — I10 Essential (primary) hypertension: Secondary | ICD-10-CM

## 2018-11-21 DIAGNOSIS — E782 Mixed hyperlipidemia: Secondary | ICD-10-CM

## 2018-11-22 ENCOUNTER — Ambulatory Visit: Payer: Self-pay | Admitting: Adult Health

## 2018-11-22 NOTE — Telephone Encounter (Signed)
Filled for 90 days. COVID-19 protocol.

## 2018-11-30 ENCOUNTER — Telehealth: Payer: Self-pay

## 2018-11-30 NOTE — Telephone Encounter (Signed)
Pt called to see about setting up appt for continued lightheaded/dizziness. Per appt desk he has cancelled and no showed twice in past 2 weeks. He asked about getting the cardiac stress test done as he never had that completed. Per appt desk he also no showed his cardiology NP appt.   Pt would like advice on what you would recommend. He is ok with visit with you if needed.

## 2018-11-30 NOTE — Telephone Encounter (Signed)
Will need webex visit to start off with

## 2018-12-01 ENCOUNTER — Ambulatory Visit: Payer: 59 | Admitting: Adult Health

## 2018-12-01 ENCOUNTER — Telehealth: Payer: Self-pay | Admitting: Adult Health

## 2018-12-01 NOTE — Telephone Encounter (Signed)
Pt is now on schedule to see Cory at 1:00 PM today.  Nothing further needed.

## 2018-12-01 NOTE — Telephone Encounter (Signed)
Patient scheduled for Webex. He was not present during time he was scheduled for. Called his phone number in chart, unable to leave vm due to vm box being full.

## 2019-01-26 ENCOUNTER — Other Ambulatory Visit: Payer: Self-pay | Admitting: Adult Health

## 2019-01-26 DIAGNOSIS — Z760 Encounter for issue of repeat prescription: Secondary | ICD-10-CM

## 2019-01-26 DIAGNOSIS — I10 Essential (primary) hypertension: Secondary | ICD-10-CM

## 2019-01-26 NOTE — Telephone Encounter (Signed)
Tried to reach the pt.  Received a message that the mailbox is full.  Will try again at a later time. 

## 2019-02-01 ENCOUNTER — Telehealth: Payer: Self-pay | Admitting: Adult Health

## 2019-02-01 NOTE — Telephone Encounter (Signed)
Copied from CRM [id number]. Topic: Quick Communication - Rx Refill/Question >> Feb 01, 2019 11:15 AM Jaquita Rector A wrote: Medication: amLODipine (NORVASC) 5 MG tablet   Has the patient contacted their pharmacy? Yes.   (Agent: If no, request that the patient contact the pharmacy for the refill.) (Agent: If yes, when and what did the pharmacy advise?)  Preferred Pharmacy (with phone number or street name): [id number] [place], [place] - [address] [phone] (Phone) [phone] (Fax)    Agent: Please be advised that RX refills may take up to 3 business days. We ask that you follow-up with your pharmacy.

## 2019-02-01 NOTE — Telephone Encounter (Signed)
Ok to fill for 90 days

## 2019-02-01 NOTE — Telephone Encounter (Signed)
Sent to the pharmacy by e-scribe. 

## 2019-02-01 NOTE — Telephone Encounter (Signed)
Message sent to Cory 

## 2019-02-02 ENCOUNTER — Encounter: Payer: Self-pay | Admitting: Adult Health

## 2019-02-02 ENCOUNTER — Other Ambulatory Visit: Payer: Self-pay

## 2019-02-02 ENCOUNTER — Ambulatory Visit (INDEPENDENT_AMBULATORY_CARE_PROVIDER_SITE_OTHER): Payer: 59 | Admitting: Adult Health

## 2019-02-02 DIAGNOSIS — I10 Essential (primary) hypertension: Secondary | ICD-10-CM | POA: Diagnosis not present

## 2019-02-02 NOTE — Progress Notes (Signed)
Virtual Visit via Video Note  I connected with Marc Hancock  on 02/02/19 at  1:30 PM EDT by a video enabled telemedicine application and verified that I am speaking with the correct person using two identifiers.  Location patient: home Location provider:work or home office Persons participating in the virtual visit: patient, provider  I discussed the limitations of evaluation and management by telemedicine and the availability of in person appointments. The patient expressed understanding and agreed to proceed.   HPI: 48 year old male who is being evaluated today for essential hypertension.  He is currently prescribed losartan 100 mg as well as Norvasc 5 mg daily.  He has been monitoring his blood pressures at home periodically, reports readings between 115 -190's -70s to 100s.  Most of his blood pressure readings are within the normal range.  He does report taking his blood pressure medication on a daily basis.  Using his wrist cuff during this visit his BP was 117/84.   He has had some dizziness but reports that he also has not been drinking much fluids.  Denies headaches or blurred vision.   ROS: See pertinent positives and negatives per HPI.  Past Medical History:  Diagnosis Date  . Allergy   . GERD (gastroesophageal reflux disease)   . Hypertension     Past Surgical History:  Procedure Laterality Date  . HYDROCELE EXCISION Right 04/11/2013   Procedure: RIGHT HYDROCELECTOMY ADULT;  Surgeon: Lindaann Slough, MD;  Location: [facility];  Service: Urology;  Laterality: Right;  . INGUINAL HERNIA REPAIR Right 04/11/2013   Procedure: HERNIA REPAIR INGUINAL ADULT;  Surgeon: Ernestene Mention, MD;  Location: [facility];  Service: General;  Laterality: Right;  Marland Kitchen VASECTOMY Bilateral 04/11/2013   Procedure: BILATERAL VASECTOMY;  Surgeon: Lindaann Slough, MD;  Location: [facility];  Service: Urology;  Laterality: Bilateral;    Family History  Problem Relation Age of Onset  . Alcohol abuse Unknown   .  Arthritis Unknown   . Heart disease Unknown   . Stroke Unknown   . Depression Unknown   . Diabetes Unknown      Current Outpatient Medications:  .  amLODipine (NORVASC) 5 MG tablet, TAKE 1 TABLET BY MOUTH EVERY DAY, Disp: 90 tablet, Rfl: 0 .  aspirin 81 MG EC tablet, TAKE 1 TABLET BY MOUTH EVERY DAY IN THE EVENING, Disp: 60 tablet, Rfl: 0 .  losartan (COZAAR) 100 MG tablet, TAKE 1 TABLET BY MOUTH EVERY DAY, Disp: 90 tablet, Rfl: 0 .  pantoprazole (PROTONIX) 40 MG tablet, Take 1 tablet (40 mg total) by mouth daily., Disp: 90 tablet, Rfl: 3 .  pravastatin (PRAVACHOL) 20 MG tablet, TAKE 1 TABLET BY MOUTH EVERY DAY, Disp: 90 tablet, Rfl: 0  EXAM:  VITALS per patient if applicable:  GENERAL: alert, oriented, appears well and in no acute distress  HEENT: atraumatic, conjunttiva clear, no obvious abnormalities on inspection of external nose and ears  NECK: normal movements of the head and neck  LUNGS: on inspection no signs of respiratory distress, breathing rate appears normal, no obvious gross SOB, gasping or wheezing  CV: no obvious cyanosis  MS: moves all visible extremities without noticeable abnormality  PSYCH/NEURO: pleasant and cooperative, no obvious depression or anxiety, speech and thought processing grossly intact  ASSESSMENT AND PLAN:  Discussed the following assessment and plan:  Essential hypertension -I would like him to get an arm cuff and monitor his blood pressures twice daily until he comes in later this month for his physical.  Return  precautions were reviewed and when to follow-up sooner if needed.  Urged to drink more fluids as well   I discussed the assessment and treatment plan with the patient. The patient was provided an opportunity to ask questions and all were answered. The patient agreed with the plan and demonstrated an understanding of the instructions.   The patient was advised to call back or seek an in-person evaluation if the symptoms worsen or  if the condition fails to improve as anticipated.   Shirline Freesory Jihad Brownlow, NP

## 2019-02-06 ENCOUNTER — Telehealth: Payer: Self-pay | Admitting: *Deleted

## 2019-02-06 NOTE — Telephone Encounter (Signed)
Copied from [place] [id number]. Topic: Appointment Scheduling - Scheduling Inquiry for Clinic >> Feb 06, 2019  4:43 PM Sheran Luz wrote: Reason for CRM: Patient calling to scheduling CPE

## 2019-02-07 NOTE — Telephone Encounter (Signed)
Spoke to the pt.  He needs to check and see when he has a dentist appointment and will call back to schedule.  Nothing further needed.

## 2019-02-15 ENCOUNTER — Ambulatory Visit (INDEPENDENT_AMBULATORY_CARE_PROVIDER_SITE_OTHER): Payer: 59 | Admitting: Adult Health

## 2019-02-15 ENCOUNTER — Encounter: Payer: Self-pay | Admitting: Adult Health

## 2019-02-15 ENCOUNTER — Other Ambulatory Visit: Payer: Self-pay

## 2019-02-15 VITALS — BP 144/86 | Temp 98.0°F | Ht 67.0 in | Wt 168.0 lb

## 2019-02-15 DIAGNOSIS — Z0000 Encounter for general adult medical examination without abnormal findings: Secondary | ICD-10-CM | POA: Diagnosis not present

## 2019-02-15 DIAGNOSIS — R5383 Other fatigue: Secondary | ICD-10-CM | POA: Diagnosis not present

## 2019-02-15 DIAGNOSIS — I10 Essential (primary) hypertension: Secondary | ICD-10-CM

## 2019-02-15 DIAGNOSIS — Z125 Encounter for screening for malignant neoplasm of prostate: Secondary | ICD-10-CM

## 2019-02-15 DIAGNOSIS — K2100 Gastro-esophageal reflux disease with esophagitis, without bleeding: Secondary | ICD-10-CM

## 2019-02-15 DIAGNOSIS — E782 Mixed hyperlipidemia: Secondary | ICD-10-CM

## 2019-02-15 DIAGNOSIS — E785 Hyperlipidemia, unspecified: Secondary | ICD-10-CM | POA: Insufficient documentation

## 2019-02-15 LAB — COMPREHENSIVE METABOLIC PANEL
ALT: 11 U/L (ref 0–53)
AST: 14 U/L (ref 0–37)
Albumin: 4.2 g/dL (ref 3.5–5.2)
Alkaline Phosphatase: 74 U/L (ref 39–117)
BUN: 14 mg/dL (ref 6–23)
CO2: 31 mEq/L (ref 19–32)
Calcium: 9.5 mg/dL (ref 8.4–10.5)
Chloride: 104 mEq/L (ref 96–112)
Creatinine, Ser: 1.14 mg/dL (ref 0.40–1.50)
GFR: 82.36 mL/min (ref >60.00)
Glucose, Bld: 75 mg/dL (ref 70–99)
Potassium: 4.4 mEq/L (ref 3.5–5.1)
Sodium: 140 mEq/L (ref 135–145)
Total Bilirubin: 1 mg/dL (ref 0.2–1.2)
Total Protein: 6.7 g/dL (ref 6.0–8.3)

## 2019-02-15 LAB — CBC WITH DIFFERENTIAL/PLATELET
Basophils Absolute: 0 10*3/uL (ref 0.0–0.1)
Basophils Relative: 0.6 % (ref 0.0–3.0)
Eosinophils Absolute: 0.1 10*3/uL (ref 0.0–0.7)
Eosinophils Relative: 1.3 % (ref 0.0–5.0)
HCT: 36.6 % — ABNORMAL LOW (ref 39.0–52.0)
Hemoglobin: 12.4 g/dL — ABNORMAL LOW (ref 13.0–17.0)
Lymphocytes Relative: 44.4 % (ref 12.0–46.0)
Lymphs Abs: 3.6 10*3/uL (ref 0.7–4.0)
MCHC: 33.8 g/dL (ref 30.0–36.0)
MCV: 90.9 fl (ref 78.0–100.0)
Monocytes Absolute: 0.6 10*3/uL (ref 0.1–1.0)
Monocytes Relative: 6.8 % (ref 3.0–12.0)
Neutro Abs: 3.8 10*3/uL (ref 1.4–7.7)
Neutrophils Relative %: 46.9 % (ref 43.0–77.0)
Platelets: 211 10*3/uL (ref 150.0–400.0)
RBC: 4.03 Mil/uL — ABNORMAL LOW (ref 4.22–5.81)
RDW: 13.3 % (ref 11.5–15.5)
WBC: 8.2 10*3/uL (ref 4.0–10.5)

## 2019-02-15 LAB — LIPID PANEL
Cholesterol: 142 mg/dL (ref 0–200)
HDL: 49 mg/dL (ref >39.00)
LDL Cholesterol: 73 mg/dL (ref 0–99)
NonHDL: 92.81
Total CHOL/HDL Ratio: 3
Triglycerides: 99 mg/dL (ref 0.0–149.0)
VLDL: 19.8 mg/dL (ref 0.0–40.0)

## 2019-02-15 LAB — TSH: TSH: 0.57 u[IU]/mL (ref 0.35–4.50)

## 2019-02-15 LAB — PSA: PSA: 1.37 ng/mL (ref 0.10–4.00)

## 2019-02-15 MED ORDER — PANTOPRAZOLE SODIUM 40 MG PO TBEC: 40.0000 mg | DELAYED_RELEASE_TABLET | Freq: Every day | ORAL | 3 refills | Status: DC

## 2019-02-15 MED ORDER — LOSARTAN POTASSIUM 100 MG PO TABS: 100.0000 mg | ORAL_TABLET | Freq: Every day | ORAL | 3 refills | Status: DC

## 2019-02-15 MED ORDER — PRAVASTATIN SODIUM 20 MG PO TABS: 20.0000 mg | ORAL_TABLET | Freq: Every day | ORAL | 3 refills | Status: DC

## 2019-02-15 NOTE — Progress Notes (Signed)
Subjective:    Patient ID: Marc Hancock, male    DOB: [birth year], 48 y.o.   MRN: [id number]  HPI Patient presents for yearly preventative medicine examination. She is a pleasant 48 year old male who  has a past medical history of Allergy, GERD (gastroesophageal reflux disease), and Hypertension.   Essential Hypertension -he is well controlled on Norvasc 5 mg and losartan 100 mg daily.  He has been monitoring his blood pressures at home and reports readings between 120-150/60-90's most of his blood pressure readings in the 120 range.  Hyperlipidemia - Takes Pravastatin 20 mg daily. Denies myalgias  Lab Results  Component Value Date   CHOL 145 11/10/2017   HDL 46.70 11/10/2017   LDLCALC 80 11/10/2017   TRIG 90.0 11/10/2017   CHOLHDL 3 11/10/2017   GERD - Takes Protonix. Denies n/v/d/c/abd pain.   Fatigue -been an ongoing issue for the patient he reports that he often wakes up not feeling well rested and will go through the day feeling fatigued.  He does report that he snores and has on occasion woken himself up to gasping for breath.  He has never had a sleep study done  All immunizations and health maintenance protocols were reviewed with the patient and needed orders were placed.  Appropriate screening laboratory values were ordered for the patient including screening of hyperlipidemia, renal function and hepatic function. If indicated by BPH, a PSA was ordered.  Medication reconciliation,  past medical history, social history, problem list and allergies were reviewed in detail with the patient  Goals were established with regard to weight loss, exercise, and  diet in compliance with medications  End of life planning was discussed.   Review of Systems  Constitutional: Positive for fatigue.  HENT: Negative.   Eyes: Negative.   Respiratory: Negative.   Cardiovascular: Negative.   Gastrointestinal: Negative.   Endocrine: Negative.   Genitourinary: Negative.    Musculoskeletal: Negative.   Skin: Negative.   Allergic/Immunologic: Negative.   Neurological: Negative.   Hematological: Negative.   Psychiatric/Behavioral: Negative.   All other systems reviewed and are negative.  Past Medical History:  Diagnosis Date  . Allergy   . GERD (gastroesophageal reflux disease)   . Hypertension     Social History   Socioeconomic History  . Marital status: Divorced    Spouse name: Not on file  . Number of children: Not on file  . Years of education: Not on file  . Highest education level: Not on file  Occupational History  . Not on file  Social Needs  . Financial resource strain: Not on file  . Food insecurity    Worry: Not on file    Inability: Not on file  . Transportation needs    Medical: Not on file    Non-medical: Not on file  Tobacco Use  . Smoking status: Never Smoker  . Smokeless tobacco: Never Used  Substance and Sexual Activity  . Alcohol use: No  . Drug use: Yes    Types: Marijuana    Comment: marijuana 2 x daily  . Sexual activity: Yes    Partners: Female  Lifestyle  . Physical activity    Days per week: Not on file    Minutes per session: Not on file  . Stress: Not on file  Relationships  . Social Musicianconnections    Talks on phone: Not on file    Gets together: Not on file    Attends religious service: Not on file  Active member of club or organization: Not on file    Attends meetings of clubs or organizations: Not on file    Relationship status: Not on file  . Intimate partner violence    Fear of current or ex partner: Not on file    Emotionally abused: Not on file    Physically abused: Not on file    Forced sexual activity: Not on file  Other Topics Concern  . Not on file  Social History Narrative   He works at [employer]    He is married    Has 6 children - Age 50 to 516     Past Surgical History:  Procedure Laterality Date  . HYDROCELE EXCISION Right 04/11/2013   Procedure: RIGHT HYDROCELECTOMY  ADULT;  Surgeon: Lindaann SloughMarc-Henry Nesi, MD;  Location: [facility];  Service: Urology;  Laterality: Right;  . INGUINAL HERNIA REPAIR Right 04/11/2013   Procedure: HERNIA REPAIR INGUINAL ADULT;  Surgeon: Ernestene MentionHaywood M Ingram, MD;  Location: [facility];  Service: General;  Laterality: Right;  Marland Kitchen. VASECTOMY Bilateral 04/11/2013   Procedure: BILATERAL VASECTOMY;  Surgeon: Lindaann SloughMarc-Henry Nesi, MD;  Location: [facility];  Service: Urology;  Laterality: Bilateral;    Family History  Problem Relation Age of Onset  . Alcohol abuse Unknown   . Arthritis Unknown   . Heart disease Unknown   . Stroke Unknown   . Depression Unknown   . Diabetes Unknown     No Known Allergies  Current Outpatient Medications on File Prior to Visit  Medication Sig Dispense Refill  . amLODipine (NORVASC) 5 MG tablet TAKE 1 TABLET BY MOUTH EVERY DAY 90 tablet 0  . aspirin 81 MG EC tablet TAKE 1 TABLET BY MOUTH EVERY DAY IN THE EVENING 60 tablet 0  . losartan (COZAAR) 100 MG tablet TAKE 1 TABLET BY MOUTH EVERY DAY 90 tablet 0  . pantoprazole (PROTONIX) 40 MG tablet Take 1 tablet (40 mg total) by mouth daily. 90 tablet 3  . pravastatin (PRAVACHOL) 20 MG tablet TAKE 1 TABLET BY MOUTH EVERY DAY 90 tablet 0   No current facility-administered medications on file prior to visit.     BP (!) 144/86   Temp 98 F (36.7 C)   Ht 5\' 7"  (1.702 m)   Wt 168 lb (76.2 kg)   BMI 26.31 kg/m       Objective:   Physical Exam Vitals signs and nursing note reviewed.  Constitutional:      General: He is not in acute distress.    Appearance: Normal appearance. He is well-developed and normal weight. He is not diaphoretic.  HENT:     Head: Normocephalic and atraumatic.     Right Ear: Tympanic membrane, ear canal and external ear normal.     Left Ear: Tympanic membrane, ear canal and external ear normal.     Nose: Nose normal. No congestion or rhinorrhea.     Mouth/Throat:     Pharynx: No oropharyngeal exudate.  Eyes:     General:        Right eye: No  discharge.        Left eye: No discharge.     Conjunctiva/sclera: Conjunctivae normal.     Pupils: Pupils are equal, round, and reactive to light.  Neck:     Musculoskeletal: Normal range of motion and neck supple.     Thyroid: No thyromegaly.     Trachea: No tracheal deviation.  Cardiovascular:     Rate and Rhythm: Normal rate and  regular rhythm.     Heart sounds: Normal heart sounds. No murmur. No friction rub. No gallop.   Pulmonary:     Effort: Pulmonary effort is normal. No respiratory distress.     Breath sounds: Normal breath sounds. No wheezing or rales.  Chest:     Chest wall: No tenderness.  Abdominal:     General: Bowel sounds are normal. There is no distension.     Palpations: Abdomen is soft.     Tenderness: There is no abdominal tenderness. There is no guarding or rebound.  Musculoskeletal: Normal range of motion.        General: No swelling, tenderness, deformity or signs of injury.     Right lower leg: No edema.     Left lower leg: No edema.  Lymphadenopathy:     Cervical: No cervical adenopathy.  Skin:    General: Skin is warm and dry.     Coloration: Skin is not jaundiced or pale.     Findings: No bruising, erythema, lesion or rash.  Neurological:     General: No focal deficit present.     Mental Status: He is alert and oriented to person, place, and time.     Cranial Nerves: No cranial nerve deficit.     Coordination: Coordination normal.  Psychiatric:        Mood and Affect: Mood normal.        Behavior: Behavior normal.        Thought Content: Thought content normal.        Judgment: Judgment normal.       Assessment & Plan:  1. Routine general medical examination at a health care facility - Continue to exercise and eat healthy  - Follow up in one year or sooner if needed - CBC with Differential/Platelet - Comprehensive metabolic panel - Lipid panel - TSH  2. Essential hypertension - BP controlled. No change in medications - CBC with  Differential/Platelet - Comprehensive metabolic panel - Lipid panel - TSH - losartan (COZAAR) 100 MG tablet; Take 1 tablet (100 mg total) by mouth daily.  Dispense: 90 tablet; Refill: 3  3. Mixed hyperlipidemia - Consider increase in Statin  - CBC with Differential/Platelet - Comprehensive metabolic panel - Lipid panel - TSH - pravastatin (PRAVACHOL) 20 MG tablet; Take 1 tablet (20 mg total) by mouth daily.  Dispense: 90 tablet; Refill: 3  4. Gastroesophageal reflux disease with esophagitis  - pantoprazole (PROTONIX) 40 MG tablet; Take 1 tablet (40 mg total) by mouth daily.  Dispense: 90 tablet; Refill: 3  5. Prostate cancer screening  - PSA  6. Fatigue, unspecified type - Likely sleep apnea. Will send to Pulmonary for further evaluatin  - CBC with Differential/Platelet - Comprehensive metabolic panel - Lipid panel - TSH - Ambulatory referral to Pulmonology   Dorothyann Peng, NP

## 2019-06-06 DIAGNOSIS — Z23 Encounter for immunization: Secondary | ICD-10-CM | POA: Diagnosis not present

## 2019-07-19 ENCOUNTER — Institutional Professional Consult (permissible substitution): Payer: 59 | Admitting: Pulmonary Disease

## 2019-07-21 ENCOUNTER — Other Ambulatory Visit: Payer: Self-pay

## 2019-07-21 DIAGNOSIS — Z20822 Contact with and (suspected) exposure to covid-19: Secondary | ICD-10-CM

## 2019-07-23 LAB — NOVEL CORONAVIRUS, NAA: SARS-CoV-2, NAA: NOT DETECTED

## 2019-08-22 ENCOUNTER — Other Ambulatory Visit: Payer: Self-pay | Admitting: Adult Health

## 2019-08-22 DIAGNOSIS — I10 Essential (primary) hypertension: Secondary | ICD-10-CM

## 2019-08-22 DIAGNOSIS — Z760 Encounter for issue of repeat prescription: Secondary | ICD-10-CM

## 2019-08-23 NOTE — Telephone Encounter (Signed)
Sent to the pharmacy by e-scribe. 

## 2019-08-29 ENCOUNTER — Other Ambulatory Visit: Payer: Self-pay

## 2019-08-29 ENCOUNTER — Telehealth (INDEPENDENT_AMBULATORY_CARE_PROVIDER_SITE_OTHER): Payer: BC Managed Care – PPO | Admitting: Family Medicine

## 2019-08-29 ENCOUNTER — Ambulatory Visit
Admission: EM | Admit: 2019-08-29 | Discharge: 2019-08-29 | Disposition: A | Discharge status: Home / Self Care | Payer: BC Managed Care – PPO

## 2019-08-29 ENCOUNTER — Encounter: Payer: Self-pay | Admitting: Emergency Medicine

## 2019-08-29 DIAGNOSIS — R079 Chest pain, unspecified: Secondary | ICD-10-CM | POA: Diagnosis not present

## 2019-08-29 DIAGNOSIS — I10 Essential (primary) hypertension: Secondary | ICD-10-CM | POA: Diagnosis not present

## 2019-08-29 DIAGNOSIS — F439 Reaction to severe stress, unspecified: Secondary | ICD-10-CM

## 2019-08-29 NOTE — Progress Notes (Signed)
Virtual Visit via Video Note  I connected with Marc Hancock  on 08/29/19 at  3:00 PM EST by a video enabled telemedicine application and verified that I am speaking with the correct person using two identifiers.  Location patient: home Location provider:work or home office Persons participating in the virtual visit: patient, provider  I discussed the limitations of evaluation and management by telemedicine and the availability of in person appointments. The patient expressed understanding and agreed to proceed.   HPI:  Acute telemedicine visit for HTN: -reports: BP has been running high for a week or so - he was taking his medication consistently hoping it would improve -reports is having some chest pain on the Left side of his chest - has had this the last few weeks, worse today and had to leave work, reports it is a soreness there pretty much all the time, but worse with activity and stress and feels better when rests -denies SOB, palpitations, dizziness, fevers, malaise, cough, swelling -reports BP has been running in the 130-170s/90-low 100s this past week -reports less exercise since the pandemic, increased stress and this def impacts his BP, diet has been terrible as well - diet has a lot of sodium -no tobacco or alcohol -per review of PCP notes has prior history of hypertension with "reports readings between 115 -190's -70s to 100s" back at a visit in the summer of 2020. His medications were not adjusted at the time as the in office BP reading was ok -current BP medications include losartan 100mg  and norvasc 5 mg -lipids and CMP ok at CPE in summer 2020 -other pertinent PMH: hyperlipidemia listed on problem list  ROS: See pertinent positives and negatives per HPI.  Past Medical History:  Diagnosis Date  . Allergy   . GERD (gastroesophageal reflux disease)   . Hypertension     Past Surgical History:  Procedure Laterality Date  . HYDROCELE EXCISION Right 04/11/2013   Procedure:  RIGHT HYDROCELECTOMY ADULT;  Surgeon: Hanley Ben, MD;  Location: [facility];  Service: Urology;  Laterality: Right;  . INGUINAL HERNIA REPAIR Right 04/11/2013   Procedure: HERNIA REPAIR INGUINAL ADULT;  Surgeon: Adin Hector, MD;  Location: [facility];  Service: General;  Laterality: Right;  Marland Kitchen VASECTOMY Bilateral 04/11/2013   Procedure: BILATERAL VASECTOMY;  Surgeon: Hanley Ben, MD;  Location: [facility];  Service: Urology;  Laterality: Bilateral;    Family History  Problem Relation Age of Onset  . Alcohol abuse Unknown   . Arthritis Unknown   . Heart disease Unknown   . Stroke Unknown   . Depression Unknown   . Diabetes Unknown     SOCIAL HX: see hpi   Current Outpatient Medications:  .  amLODipine (NORVASC) 5 MG tablet, TAKE 1 TABLET BY MOUTH EVERY DAY, Disp: 90 tablet, Rfl: 1 .  aspirin 81 MG EC tablet, TAKE 1 TABLET BY MOUTH EVERY DAY IN THE EVENING, Disp: 60 tablet, Rfl: 0 .  losartan (COZAAR) 100 MG tablet, Take 1 tablet (100 mg total) by mouth daily., Disp: 90 tablet, Rfl: 3 .  pantoprazole (PROTONIX) 40 MG tablet, Take 1 tablet (40 mg total) by mouth daily., Disp: 90 tablet, Rfl: 3 .  pravastatin (PRAVACHOL) 20 MG tablet, Take 1 tablet (20 mg total) by mouth daily., Disp: 90 tablet, Rfl: 3  EXAM:  VITALS per patient if applicable:BP now is 497/02, P 90  GENERAL: alert, oriented, appears well and in no acute distress  HEENT: atraumatic, conjunttiva clear, no obvious abnormalities on  inspection of external nose and ears  NECK: normal movements of the head and neck  LUNGS: on inspection no signs of respiratory distress, breathing rate appears normal, no obvious gross SOB, gasping or wheezing  CV: no obvious cyanosis  MS: moves all visible extremities without noticeable abnormality  PSYCH/NEURO: pleasant and cooperative, no obvious depression or anxiety, speech and thought processing grossly intact  ASSESSMENT AND PLAN:  Discussed the following assessment and  plan:  Essential hypertension  Chest pain, unspecified type  Stress  -we discussed possible serious and likely etiologies, options for evaluation and workup, limitations of telemedicine visit vs in person visit, treatment, treatment risks and precautions. We had a lengthy discussion and he likely will need increase in his BP regimen. However, acutely he needs evaluation of the CP. While it may be related to the increased stress and BP or be musculoskeletal, I am concerned given his PMH, the fact that it is worsening enough he had to leave work today and that it seems to have some exertional component. Advised that he seek prompt inperson evaluation at the nearest UC/ER to r/o cardiac, covid vs other etiologies. He agrees to go immediately and declines assistance transportation. He is considering which he will go to. He agrees to contact his PCP after that visit/evaluation/mamangement for follow up. I'll copy this note to his PCP.   I discussed the assessment and treatment plan with the patient. The patient was provided an opportunity to ask questions and all were answered. The patient agreed with the plan and demonstrated an understanding of the instructions.   The patient was advised to call back or seek an in-person evaluation if the symptoms worsen or if the condition fails to improve as anticipated.   Terressa Koyanagi, DO

## 2019-08-29 NOTE — Progress Notes (Signed)
Hi Jarrett Soho  Thank you for seeing him and glad to see he went to the ER.   Christmas was relaxing - which is just what I needed. Hope yours was good and you are doing well.  Tommi Rumps

## 2019-08-29 NOTE — ED Triage Notes (Addendum)
Pt presents to [facility] for assessment of hypertension, which has had a wrist band that he uses at home.  Readings have been 180s over 100s.  Patient c/o severe headaches x 3-4 days, but resolved at this time, occasional left chest pressure (none at this time).  Denies n/v, denies light-headedness or dizziness, denies weakness or numbness.  Spoke to someone on mychart today who encouraged him to be seen.

## 2019-08-29 NOTE — ED Provider Notes (Signed)
[facility]    CSN: [id number] Arrival date & time: 08/29/19  1528      History   Chief Complaint Chief Complaint  Patient presents with   Hypertension    HPI Marc Hancock is a 48 y.o. male with history hypertension, allergies, GERD presenting for elevated blood pressure readings.  States he has a wrist BP cuff at home.  Readings for the last few days have been 180s/100s and patient has been experiencing some frontal headaches.  States he took ibuprofen for headaches, which resolve them.  No headache today in office.  Patient does endorse chronic history of intermittent left-sided chest discomfort.  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  Patient denies jaw pain, nausea, vomiting, shortness of breath.  Does have some left tricep tightness that is also been intermittent, occasionally radiates into ipsilateral trapezius,for the last 1.5 months.  Patient does feel that this tends to happen more so when he is having chest discomfort.  Patient reports compliance with antihypertensives.  States he had a virtual visit with his primary care this morning who recommended UC/ER evaluation for chest pain.   Past Medical History:  Diagnosis Date   Allergy    GERD (gastroesophageal reflux disease)    Hypertension     Patient Active Problem List   Diagnosis Date Noted   Hyperlipidemia 02/15/2019   Fatigue 02/15/2019   Nephrolithiasis 01/01/2014   Right inguinal hernia 03/01/2013   Back pain 11/22/2012   GERD (gastroesophageal reflux disease) 12/11/2011   Hypertension 12/11/2011   Right hydrocele 12/11/2011    Past Surgical History:  Procedure Laterality Date   HYDROCELE EXCISION Right 04/11/2013   Procedure: RIGHT HYDROCELECTOMY ADULT;  Surgeon: Hanley Ben, MD;  Location: [facility];  Service: Urology;  Laterality: Right;   INGUINAL HERNIA REPAIR Right 04/11/2013   Procedure: HERNIA REPAIR INGUINAL ADULT;  Surgeon: Adin Hector, MD;  Location: [facility];  Service: General;  Laterality: Right;   VASECTOMY Bilateral 04/11/2013   Procedure: BILATERAL VASECTOMY;  Surgeon: Hanley Ben, MD;  Location: [facility];  Service: Urology;  Laterality: Bilateral;       Home Medications    Prior to Admission medications   Medication Sig Start Date End Date Taking? Authorizing Provider  amLODipine (NORVASC) 5 MG tablet TAKE 1 TABLET BY MOUTH EVERY DAY 08/23/19   Nafziger, Tommi Rumps, NP  aspirin 81 MG EC tablet TAKE 1 TABLET BY MOUTH EVERY DAY IN THE EVENING 11/03/17   Nafziger, Tommi Rumps, NP  losartan (COZAAR) 100 MG tablet Take 1 tablet (100 mg total) by mouth daily. 02/15/19   Nafziger, Tommi Rumps, NP  pantoprazole (PROTONIX) 40 MG tablet Take 1 tablet (40 mg total) by mouth daily. 02/15/19   Nafziger, Tommi Rumps, NP  pravastatin (PRAVACHOL) 20 MG tablet Take 1 tablet (20 mg total) by mouth daily. 02/15/19   Nafziger, Tommi Rumps, NP    Family History Family History  Problem Relation Age of Onset   Alcohol abuse Other    Arthritis Other    Heart disease Other    Stroke Other    Depression Other    Diabetes Other     Social History Social History   Tobacco Use   Smoking status: Passive Smoke Exposure - Never Smoker   Smokeless tobacco: Never Used  Substance Use Topics   Alcohol use: Yes    Comment: 1-2 drinks a month   Drug use: Yes    Types: Marijuana    Comment:  marijuana 2 x daily     Allergies   Patient has no known allergies.   Review of Systems As per HPI   Physical Exam Triage Vital Signs ED Triage Vitals  Enc Vitals Group     BP      Pulse      Resp      Temp      Temp src      SpO2      Weight      Height      Head Circumference      Peak Flow      Pain Score      Pain Loc      Pain Edu?      Excl. in GC?    No data found.  Updated Vital Signs BP (!) 149/79 (BP Location: Left Arm)    Pulse 65    Temp 98.3 F (36.8 C) (Oral)    Resp 18    SpO2 98%   Visual Acuity Right Eye Distance:     Left Eye Distance:   Bilateral Distance:    Right Eye Near:   Left Eye Near:    Bilateral Near:     Physical Exam Constitutional:      General: He is not in acute distress.    Appearance: He is normal weight. He is not ill-appearing.  HENT:     Head: Normocephalic and atraumatic.     Mouth/Throat:     Mouth: Mucous membranes are moist.     Pharynx: Oropharynx is clear. No pharyngeal swelling or oropharyngeal exudate.  Eyes:     General: No scleral icterus.    Conjunctiva/sclera: Conjunctivae normal.     Pupils: Pupils are equal, round, and reactive to light.  Neck:     Vascular: No JVD.     Trachea: No tracheal deviation.     Comments: Trachea midline, negative JVD Cardiovascular:     Rate and Rhythm: Normal rate and regular rhythm.     Pulses: Normal pulses.     Heart sounds: No murmur. No friction rub. No gallop.   Pulmonary:     Effort: Pulmonary effort is normal. No respiratory distress.     Breath sounds: No decreased breath sounds, wheezing, rhonchi or rales.  Chest:     Chest wall: No deformity, tenderness or crepitus.  Musculoskeletal:        General: Normal range of motion.     Cervical back: No tenderness.     Right lower leg: No tenderness. No edema.     Left lower leg: No tenderness. No edema.  Lymphadenopathy:     Cervical: No cervical adenopathy.  Skin:    General: Skin is warm.     Capillary Refill: Capillary refill takes less than 2 seconds.     Coloration: Skin is not cyanotic, jaundiced or pale.     Findings: No rash.  Neurological:     General: No focal deficit present.     Mental Status: He is alert and oriented to person, place, and time.  Psychiatric:        Mood and Affect: Mood normal.        Behavior: Behavior normal.      UC Treatments / Results  Labs (all labs ordered are listed, but only abnormal results are displayed) Labs Reviewed - No data to display  EKG   Radiology No results found.  Procedures Procedures (including  critical care time)  Medications Ordered in UC Medications -  No data to display  Initial Impression / Assessment and Plan / UC Course  I have reviewed the triage vital signs and the nursing notes.  Pertinent labs & imaging results that were available during my care of the patient were reviewed by me and considered in my medical decision making (see chart for details).     Nontoxic, afebrile, asymptomatic in office today.  Initially hypertensive, trending towards normal will before discharge.  Patient had normal EKG 09/22/2016 is reviewed by me at time of visit today.  Discussed utility of EKG today as screening tool for cardiac etiology of symptoms.  Patient feels this is unnecessary as "my chest is not hurting right now".  Discussed importance of following up with cardiology as they can do further evaluation including Holter monitoring, echo, etc.  Symptoms are suspicious for stable angina: Stressed importance of patient coming to UC/ER at time of chest pain for better screening via EKG.  Also discussed importance of symptom and BP log for review with PCP, cardiology.  ER return precautions discussed, patient verbalized understanding and is agreeable to plan. Final Clinical Impressions(s) / UC Diagnoses   Final diagnoses:  Essential hypertension     Discharge Instructions     Important to keep a symptom log blood pressure log if you are able to to bring with you to next PCP appointment or heart appointment which ever is first. Recommend he follow-up with cardiology for further evaluation, management as well as cardiovascular disease screening. In the interim, very important to go to the ER if you develop persistent, worsening chest pain, difficulty breathing, nausea, vomiting, left arm weakness, numbness, lightheadedness, sweating.    ED Prescriptions    None     PDMP not reviewed this encounter.   Odette Fraction [place], [place] 08/30/19 2103

## 2019-08-29 NOTE — Discharge Instructions (Addendum)
Important to keep a symptom log blood pressure log if you are able to to bring with you to next PCP appointment or heart appointment which ever is first. Recommend he follow-up with cardiology for further evaluation, management as well as cardiovascular disease screening. In the interim, very important to go to the ER if you develop persistent, worsening chest pain, difficulty breathing, nausea, vomiting, left arm weakness, numbness, lightheadedness, sweating.

## 2019-08-30 ENCOUNTER — Ambulatory Visit: Payer: BC Managed Care – PPO | Admitting: Pulmonary Disease

## 2019-08-30 ENCOUNTER — Other Ambulatory Visit: Payer: Self-pay

## 2019-08-30 ENCOUNTER — Encounter: Payer: Self-pay | Admitting: Pulmonary Disease

## 2019-08-30 VITALS — BP 144/82 | HR 65 | Temp 97.0°F | Ht 67.0 in | Wt 175.2 lb

## 2019-08-30 DIAGNOSIS — G4733 Obstructive sleep apnea (adult) (pediatric): Secondary | ICD-10-CM

## 2019-08-30 DIAGNOSIS — R0600 Dyspnea, unspecified: Secondary | ICD-10-CM | POA: Diagnosis not present

## 2019-08-30 NOTE — Patient Instructions (Signed)
Fatigue and shortness of breath  Fatigue likely related to nonrestorative sleep With your history of snoring-making sure you do not sleep apnea is important  We will schedule you for a home sleep study  We will get a breathing study to assess your shortness of breath  I will see you in about 6 to 8 weeks  You may try melatonin 5 to 10 mg at night-may help with sleeping Sleep Apnea Sleep apnea is a condition in which breathing pauses or becomes shallow during sleep. Episodes of sleep apnea usually last 10 seconds or longer, and they may occur as many as 20 times an hour. Sleep apnea disrupts your sleep and keeps your body from getting the rest that it needs. This condition can increase your risk of certain health problems, including:  Heart attack.  Stroke.  Obesity.  Diabetes.  Heart failure.  Irregular heartbeat. What are the causes? There are three kinds of sleep apnea:  Obstructive sleep apnea. This kind is caused by a blocked or collapsed airway.  Central sleep apnea. This kind happens when the part of the brain that controls breathing does not send the correct signals to the muscles that control breathing.  Mixed sleep apnea. This is a combination of obstructive and central sleep apnea. The most common cause of this condition is a collapsed or blocked airway. An airway can collapse or become blocked if:  Your throat muscles are abnormally relaxed.  Your tongue and tonsils are larger than normal.  You are overweight.  Your airway is smaller than normal. What increases the risk? You are more likely to develop this condition if you:  Are overweight.  Smoke.  Have a smaller than normal airway.  Are elderly.  Are male.  Drink alcohol.  Take sedatives or tranquilizers.  Have a family history of sleep apnea. What are the signs or symptoms? Symptoms of this condition include:  Trouble staying asleep.  Daytime sleepiness and  tiredness.  Irritability.  Loud snoring.  Morning headaches.  Trouble concentrating.  Forgetfulness.  Decreased interest in sex.  Unexplained sleepiness.  Mood swings.  Personality changes.  Feelings of depression.  Waking up often during the night to urinate.  Dry mouth.  Sore throat. How is this diagnosed? This condition may be diagnosed with:  A medical history.  A physical exam.  A series of tests that are done while you are sleeping (sleep study). These tests are usually done in a sleep lab, but they may also be done at home. How is this treated? Treatment for this condition aims to restore normal breathing and to ease symptoms during sleep. It may involve managing health issues that can affect breathing, such as high blood pressure or obesity. Treatment may include:  Sleeping on your side.  Using a decongestant if you have nasal congestion.  Avoiding the use of depressants, including alcohol, sedatives, and narcotics.  Losing weight if you are overweight.  Making changes to your diet.  Quitting smoking.  Using a device to open your airway while you sleep, such as: ? An oral appliance. This is a custom-made mouthpiece that shifts your lower jaw forward. ? A continuous positive airway pressure (CPAP) device. This device blows air through a mask when you breathe out (exhale). ? A nasal expiratory positive airway pressure (EPAP) device. This device has valves that you put into each nostril. ? A bi-level positive airway pressure (BPAP) device. This device blows air through a mask when you breathe in (inhale) and breathe  out (exhale).  Having surgery if other treatments do not work. During surgery, excess tissue is removed to create a wider airway. It is important to get treatment for sleep apnea. Without treatment, this condition can lead to:  High blood pressure.  Coronary artery disease.  In men, an inability to achieve or maintain an erection  (impotence).  Reduced thinking abilities. Follow these instructions at home: Lifestyle  Make any lifestyle changes that your health care provider recommends.  Eat a healthy, well-balanced diet.  Take steps to lose weight if you are overweight.  Avoid using depressants, including alcohol, sedatives, and narcotics.  Do not use any products that contain nicotine or tobacco, such as cigarettes, e-cigarettes, and chewing tobacco. If you need help quitting, ask your health care provider. General instructions  Take over-the-counter and prescription medicines only as told by your health care provider.  If you were given a device to open your airway while you sleep, use it only as told by your health care provider.  If you are having surgery, make sure to tell your health care provider you have sleep apnea. You may need to bring your device with you.  Keep all follow-up visits as told by your health care provider. This is important. Contact a health care provider if:  The device that you received to open your airway during sleep is uncomfortable or does not seem to be working.  Your symptoms do not improve.  Your symptoms get worse. Get help right away if:  You develop: ? Chest pain. ? Shortness of breath. ? Discomfort in your back, arms, or stomach.  You have: ? Trouble speaking. ? Weakness on one side of your body. ? Drooping in your face. These symptoms may represent a serious problem that is an emergency. Do not wait to see if the symptoms will go away. Get medical help right away. Call your local emergency services (911 in the [place] Do not drive yourself to the hospital. Summary  Sleep apnea is a condition in which breathing pauses or becomes shallow during sleep.  The most common cause is a collapsed or blocked airway.  The goal of treatment is to restore normal breathing and to ease symptoms during sleep. This information is not intended to replace advice given to you  by your health care provider. Make sure you discuss any questions you have with your health care provider. Document Released: 08/07/2002 Document Revised: 06/03/2018 Document Reviewed: 04/12/2018 Elsevier Patient Education  2020 [employer].

## 2019-08-30 NOTE — Progress Notes (Signed)
Subjective:    Patient ID: Marc Hancock, male    DOB: 11-Dec-1970, 48 y.o.   MRN: [id number]  Patient is being seen today for fatigue and shortness of breath  Shortness of breath with moderate exertion Not really enough to stop him doing what is trying to do, just more aware of his breathing Never smoker Works in a Technical brewer to some chemicals No history of shortness of breath when he was younger He has no wheezing, no cough, no symptoms suggesting an infectious process  He is a snorer No witnessed apneas Usually goes to bed between 1030 and 1130 Falls asleep in a few minutes wakes up a few times during the night Final awakening time about 5 to 5:30 AM About 10 pound weight gain  He does have dryness of his mouth in the mornings Occasional headaches He has a brother who snores heavily  His memory is decent Occasional lapses in concentration  He is not as active as he used to be since Covid   Past Medical History:  Diagnosis Date  . Allergy   . GERD (gastroesophageal reflux disease)   . Hypertension     Social History   Socioeconomic History  . Marital status: Married    Spouse name: Not on file  . Number of children: Not on file  . Years of education: Not on file  . Highest education level: Not on file  Occupational History  . Not on file  Tobacco Use  . Smoking status: Passive Smoke Exposure - Never Smoker  . Smokeless tobacco: Never Used  Substance and Sexual Activity  . Alcohol use: Yes    Comment: 1-2 drinks a month  . Drug use: Yes    Types: Marijuana    Comment: marijuana 2 x daily  . Sexual activity: Yes    Partners: Female  Other Topics Concern  . Not on file  Social History Narrative   He works at [employer]    He is married    Has 6 children - Age 2 to 63    Social Determinants of Health   Financial Resource Strain:   . Difficulty of Paying Living Expenses: Not on file  Food Insecurity:   . Worried About Ship broker in the Last Year: Not on file  . Ran Out of Food in the Last Year: Not on file  Transportation Needs:   . Lack of Transportation (Medical): Not on file  . Lack of Transportation (Non-Medical): Not on file  Physical Activity:   . Days of Exercise per Week: Not on file  . Minutes of Exercise per Session: Not on file  Stress:   . Feeling of Stress : Not on file  Social Connections:   . Frequency of Communication with Friends and Family: Not on file  . Frequency of Social Gatherings with Friends and Family: Not on file  . Attends Religious Services: Not on file  . Active Member of Clubs or Organizations: Not on file  . Attends Archivist Meetings: Not on file  . Marital Status: Not on file  Intimate Partner Violence:   . Fear of Current or Ex-Partner: Not on file  . Emotionally Abused: Not on file  . Physically Abused: Not on file  . Sexually Abused: Not on file   Family History  Problem Relation Age of Onset  . Alcohol abuse Other   . Arthritis Other   . Heart disease Other   . Stroke  Other   . Depression Other   . Diabetes Other    Review of Systems  Constitutional: Negative.   HENT: Negative.   Respiratory: Positive for shortness of breath.   Psychiatric/Behavioral: Positive for sleep disturbance.  All other systems reviewed and are negative.      Objective:   Physical Exam Constitutional:      Appearance: Normal appearance.  HENT:     Head: Normocephalic.     Nose: Nose normal. No congestion.     Mouth/Throat:     Mouth: Mucous membranes are moist.     Comments: Mallampati 2, crowded oropharynx Eyes:     Pupils: Pupils are equal, round, and reactive to light.  Cardiovascular:     Rate and Rhythm: Normal rate.     Pulses: Normal pulses.     Heart sounds: No murmur. No friction rub.  Pulmonary:     Effort: Pulmonary effort is normal. No respiratory distress.     Breath sounds: Normal breath sounds. No stridor. No wheezing or rhonchi.    Abdominal:     General: Abdomen is flat.  Musculoskeletal:     Cervical back: Normal range of motion. No rigidity or tenderness.  Skin:    General: Skin is warm.  Neurological:     General: No focal deficit present.     Mental Status: He is alert.  Psychiatric:        Mood and Affect: Mood normal.        Behavior: Behavior normal.    Vitals:   08/30/19 0906  BP: (!) 144/82  Pulse: 65  Temp: (!) 97 F (36.1 C)  SpO2: 99%   Epworth of 6     Assessment & Plan:  .  Snoring -May have primary snoring -Associated fatigue  .  Possible obstructive sleep apnea -History of snoring -Daytime fatigue -Nonrestorative sleep -Multiple awakenings  Shortness of breath -Has no underlying lung disease, never smoker -We will obtain a pulmonary function test  Pathophysiology of sleep disordered breathing discussed with the patient Treatment options for sleep disordered breathing discussed with the patient  Multiple awakenings will be addressed following ruling out significant obstructive sleep apnea  Plan: Encourage him to spend enough hours in bed to get 6 to 8 hours of sleep Behavioral modifications that promote sleep encouraged Regular exercises  We will schedule patient for home sleep study  We will obtain a pulmonary function study  I will see him in about 6 to 8 weeks

## 2019-09-22 ENCOUNTER — Ambulatory Visit (INDEPENDENT_AMBULATORY_CARE_PROVIDER_SITE_OTHER): Payer: BC Managed Care – PPO

## 2019-09-22 ENCOUNTER — Encounter: Payer: Self-pay | Admitting: Adult Health

## 2019-09-22 ENCOUNTER — Ambulatory Visit: Payer: BC Managed Care – PPO | Admitting: Adult Health

## 2019-09-22 ENCOUNTER — Other Ambulatory Visit: Payer: Self-pay

## 2019-09-22 VITALS — BP 150/100 | HR 82 | Temp 98.5°F | Ht 67.0 in | Wt 170.0 lb

## 2019-09-22 DIAGNOSIS — I10 Essential (primary) hypertension: Secondary | ICD-10-CM

## 2019-09-22 DIAGNOSIS — R079 Chest pain, unspecified: Secondary | ICD-10-CM | POA: Diagnosis not present

## 2019-09-22 LAB — BASIC METABOLIC PANEL
BUN: 13 mg/dL (ref 6–23)
CO2: 27 mEq/L (ref 19–32)
Calcium: 9.6 mg/dL (ref 8.4–10.5)
Chloride: 104 mEq/L (ref 96–112)
Creatinine, Ser: 1.05 mg/dL (ref 0.40–1.50)
GFR: 90.34 mL/min (ref >60.00)
Glucose, Bld: 86 mg/dL (ref 70–99)
Potassium: 3.6 mEq/L (ref 3.5–5.1)
Sodium: 140 mEq/L (ref 135–145)

## 2019-09-22 LAB — CBC WITH DIFFERENTIAL/PLATELET
Basophils Absolute: 0.1 10*3/uL (ref 0.0–0.1)
Basophils Relative: 0.6 % (ref 0.0–3.0)
Eosinophils Absolute: 0.2 10*3/uL (ref 0.0–0.7)
Eosinophils Relative: 1.9 % (ref 0.0–5.0)
HCT: 38.9 % — ABNORMAL LOW (ref 39.0–52.0)
Hemoglobin: 12.9 g/dL — ABNORMAL LOW (ref 13.0–17.0)
Lymphocytes Relative: 39.9 % (ref 12.0–46.0)
Lymphs Abs: 3.3 10*3/uL (ref 0.7–4.0)
MCHC: 33.1 g/dL (ref 30.0–36.0)
MCV: 91.5 fl (ref 78.0–100.0)
Monocytes Absolute: 0.6 10*3/uL (ref 0.1–1.0)
Monocytes Relative: 7.6 % (ref 3.0–12.0)
Neutro Abs: 4.1 10*3/uL (ref 1.4–7.7)
Neutrophils Relative %: 50 % (ref 43.0–77.0)
Platelets: 228 10*3/uL (ref 150.0–400.0)
RBC: 4.25 Mil/uL (ref 4.22–5.81)
RDW: 13.7 % (ref 11.5–15.5)
WBC: 8.2 10*3/uL (ref 4.0–10.5)

## 2019-09-22 NOTE — Progress Notes (Signed)
Subjective:    Patient ID: Marc Hancock, male    DOB: 03-23-1971, 49 y.o.   MRN: [id number]  HPI 49 year old male who  has a past medical history of Allergy, GERD (gastroesophageal reflux disease), and Hypertension.  He presents to the office today with the complaint of chest pain. He was originally seen on 08/29/2019 via Telemedicine visit with another provider. At that time he reported that his blood pressures were running high for a week or so. He was also having some left sided chest pain x 2-3 weeks. He had to leave work the day of this visit d/t chest pain. There was some increased chest pain with activity and a feeling a "soreness" that was present constantly.  He denied SOB, Palpitations, dizziness, fevers, malaise, cough, and swelling.   He then went to Urgent Care but he was not having symptoms at the time of presentation and felt that an EKG was not necessary   Today he reports that he continues to have intermittent left sided chest pain this feeling is felt as a " pressure" and radiates throughout his chest. It continues to happen with exertion. He does feel SOB at times but does not have any palpitations. Unfortunately he has not been monitoring his BP since he was seen via Telemedicine.    He continues to have a radiating pain down his left arm tightness that is constant. This discomfort is located in his tricep that can radiate into the trapezius. This sensation does happen when he has chest tightness but also when he is not having any chest tightness.   He denies eating a lot of sodium  He is " always under a lot of stress".   This morning he has no symptoms.   He continues to deny cough, swelling, dizziness, of GERD symptoms    Review of Systems See HPI  Past Medical History:  Diagnosis Date  . Allergy   . GERD (gastroesophageal reflux disease)   . Hypertension     Social History   Socioeconomic History  . Marital status: Married    Spouse name: Not on file   . Number of children: Not on file  . Years of education: Not on file  . Highest education level: Not on file  Occupational History  . Not on file  Tobacco Use  . Smoking status: Passive Smoke Exposure - Never Smoker  . Smokeless tobacco: Never Used  Substance and Sexual Activity  . Alcohol use: Yes    Comment: 1-2 drinks a month  . Drug use: Yes    Types: Marijuana    Comment: marijuana 2 x daily  . Sexual activity: Yes    Partners: Female  Other Topics Concern  . Not on file  Social History Narrative   He works at [employer]    He is married    Has 6 children - Age 64 to 6    Social Determinants of Health   Financial Resource Strain:   . Difficulty of Paying Living Expenses: Not on file  Food Insecurity:   . Worried About Programme researcher, broadcasting/film/video in the Last Year: Not on file  . Ran Out of Food in the Last Year: Not on file  Transportation Needs:   . Lack of Transportation (Medical): Not on file  . Lack of Transportation (Non-Medical): Not on file  Physical Activity:   . Days of Exercise per Week: Not on file  . Minutes of Exercise per Session: Not  on file  Stress:   . Feeling of Stress : Not on file  Social Connections:   . Frequency of Communication with Friends and Family: Not on file  . Frequency of Social Gatherings with Friends and Family: Not on file  . Attends Religious Services: Not on file  . Active Member of Clubs or Organizations: Not on file  . Attends Banker Meetings: Not on file  . Marital Status: Not on file  Intimate Partner Violence:   . Fear of Current or Ex-Partner: Not on file  . Emotionally Abused: Not on file  . Physically Abused: Not on file  . Sexually Abused: Not on file    Past Surgical History:  Procedure Laterality Date  . HYDROCELE EXCISION Right 04/11/2013   Procedure: RIGHT HYDROCELECTOMY ADULT;  Surgeon: Lindaann Slough, MD;  Location: [facility];  Service: Urology;  Laterality: Right;  . INGUINAL HERNIA REPAIR  Right 04/11/2013   Procedure: HERNIA REPAIR INGUINAL ADULT;  Surgeon: Ernestene Mention, MD;  Location: [facility];  Service: General;  Laterality: Right;  Marland Kitchen VASECTOMY Bilateral 04/11/2013   Procedure: BILATERAL VASECTOMY;  Surgeon: Lindaann Slough, MD;  Location: [facility];  Service: Urology;  Laterality: Bilateral;    Family History  Problem Relation Age of Onset  . Alcohol abuse Other   . Arthritis Other   . Heart disease Other   . Stroke Other   . Depression Other   . Diabetes Other     No Known Allergies  Current Outpatient Medications on File Prior to Visit  Medication Sig Dispense Refill  . amLODipine (NORVASC) 5 MG tablet TAKE 1 TABLET BY MOUTH EVERY DAY 90 tablet 1  . aspirin 81 MG EC tablet TAKE 1 TABLET BY MOUTH EVERY DAY IN THE EVENING 60 tablet 0  . losartan (COZAAR) 100 MG tablet Take 1 tablet (100 mg total) by mouth daily. 90 tablet 3  . pantoprazole (PROTONIX) 40 MG tablet Take 1 tablet (40 mg total) by mouth daily. 90 tablet 3  . pravastatin (PRAVACHOL) 20 MG tablet Take 1 tablet (20 mg total) by mouth daily. 90 tablet 3   No current facility-administered medications on file prior to visit.    BP (!) 150/100   Pulse 82   Temp 98.5 F (36.9 C) (Other (Comment))   Ht 5\' 7"  (1.702 m)   Wt 170 lb (77.1 kg)   SpO2 98%   BMI 26.63 kg/m       Objective:   Physical Exam Vitals and nursing note reviewed.  Constitutional:      Appearance: Normal appearance. He is well-developed.  Cardiovascular:     Rate and Rhythm: Normal rate and regular rhythm.     Pulses: Normal pulses.     Heart sounds: Normal heart sounds.  Pulmonary:     Effort: Pulmonary effort is normal.     Breath sounds: Normal breath sounds.  Musculoskeletal:        General: No swelling or tenderness. Normal range of motion.     Comments: No chest pain with palpitation   Skin:    General: Skin is warm and dry.  Neurological:     General: No focal deficit present.     Mental Status: He is alert and  oriented to person, place, and time.     Sensory: No sensory deficit.     Motor: No weakness.  Psychiatric:        Mood and Affect: Mood normal.  Behavior: Behavior normal.        Thought Content: Thought content normal.        Judgment: Judgment normal.       Assessment & Plan:   1. Chest pain, unspecified type - Reviewed telemedicine note and UC note Does not appear to be muscular or GERD related symptoms. Concern for stable angina. Will refer to cardiology for further evaluati - EKG 12-Lead- NSR, Rate 75 - DG Chest 2 View; Future - CBC with Differential/Platelet - Basic Metabolic Panel - DG Chest 2 View  2. Essential hypertension - elevated in the office today Increase Norvasc to 10 mg.  Follow up in 2 weeks with blood pressure log.  Dorothyann Peng, NP

## 2019-09-22 NOTE — Patient Instructions (Signed)
Your EKG was normal.   I am going to have you increase your Norvasc from 5 mg to 10 mg. Please monitor your blood pressure at home. Bring your blood pressure log with you to work   I am also going to check some blood work today and get a chest xray   Someone from cardiology will call you to schedule your appointment

## 2019-09-22 NOTE — Addendum Note (Signed)
Addended by: Nancy Fetter on: 09/22/2019 07:41 AM   Modules accepted: Orders

## 2019-10-01 NOTE — Progress Notes (Signed)
Cardiology Office Note:    Date:  10/02/2019   ID:  Soyla Murphy, DOB [birth year], MRN [id number]  PCP:  Shirline Frees, NP  Cardiologist:  No primary care provider on file.  Electrophysiologist:  None   Referring MD: Shirline Frees, NP   Chief Complaint  Patient presents with  . Chest Pain    History of Present Illness:    Marc Hancock is a 49 y.o. male with a hx of GERD, hypertension who is referred by Shirline Frees, NP for an evaluation of chest pain.  Reports chest pain started 2 months ago.  States that it occurs across his whole chest.  Describes as tightness in chest, 6-7/10 in intensity.  Has been occurring about every 2 weeks or so.  Last couple of minutes and resolves spontaneously.  Has not noted what brings it on.  He does not think there is any relationship with exertion, but does think it occurs more frequently during times of stress.  No smoking history.  Denies any heart disease in his immediate family.  Reports that he has not been exercising since the pandemic started.  Prior to this was playing basketball at the [facility].  Reports BP has not been well controlled, recently increased amlodipine to 10 mg daily and BP has been much better controlled since that time.  Past Medical History:  Diagnosis Date  . Allergy   . GERD (gastroesophageal reflux disease)   . Hypertension     Past Surgical History:  Procedure Laterality Date  . HYDROCELE EXCISION Right 04/11/2013   Procedure: RIGHT HYDROCELECTOMY ADULT;  Surgeon: Lindaann Slough, MD;  Location: [facility];  Service: Urology;  Laterality: Right;  . INGUINAL HERNIA REPAIR Right 04/11/2013   Procedure: HERNIA REPAIR INGUINAL ADULT;  Surgeon: Ernestene Mention, MD;  Location: [facility];  Service: General;  Laterality: Right;  Marland Kitchen VASECTOMY Bilateral 04/11/2013   Procedure: BILATERAL VASECTOMY;  Surgeon: Lindaann Slough, MD;  Location: [facility];  Service: Urology;  Laterality: Bilateral;    Current Medications: Current Meds  Medication  Sig  . amLODipine (NORVASC) 5 MG tablet TAKE 1 TABLET BY MOUTH EVERY DAY  . aspirin 81 MG EC tablet TAKE 1 TABLET BY MOUTH EVERY DAY IN THE EVENING  . losartan (COZAAR) 100 MG tablet Take 1 tablet (100 mg total) by mouth daily.  . pantoprazole (PROTONIX) 40 MG tablet Take 1 tablet (40 mg total) by mouth daily.  . pravastatin (PRAVACHOL) 20 MG tablet Take 1 tablet (20 mg total) by mouth daily.     Allergies:   Patient has no known allergies.   Social History   Socioeconomic History  . Marital status: Married    Spouse name: Not on file  . Number of children: Not on file  . Years of education: Not on file  . Highest education level: Not on file  Occupational History  . Not on file  Tobacco Use  . Smoking status: Passive Smoke Exposure - Never Smoker  . Smokeless tobacco: Never Used  Substance and Sexual Activity  . Alcohol use: Yes    Comment: 1-2 drinks a month  . Drug use: Yes    Types: Marijuana    Comment: marijuana 2 x daily  . Sexual activity: Yes    Partners: Female  Other Topics Concern  . Not on file  Social History Narrative   He works at [employer]    He is married    Has 6 children - Age 15 to 63  Social Determinants of Health   Financial Resource Strain:   . Difficulty of Paying Living Expenses: Not on file  Food Insecurity:   . Worried About Programme researcher, broadcasting/film/video in the Last Year: Not on file  . Ran Out of Food in the Last Year: Not on file  Transportation Needs:   . Lack of Transportation (Medical): Not on file  . Lack of Transportation (Non-Medical): Not on file  Physical Activity:   . Days of Exercise per Week: Not on file  . Minutes of Exercise per Session: Not on file  Stress:   . Feeling of Stress : Not on file  Social Connections:   . Frequency of Communication with Friends and Family: Not on file  . Frequency of Social Gatherings with Friends and Family: Not on file  . Attends Religious Services: Not on file  . Active Member of Clubs  or Organizations: Not on file  . Attends Banker Meetings: Not on file  . Marital Status: Not on file     Family History: The patient's family history includes Alcohol abuse in an other family member; Arthritis in an other family member; Depression in an other family member; Diabetes in an other family member; Heart disease in an other family member; Stroke in an other family member.  ROS:   Please see the history of present illness.     All other systems reviewed and are negative.  EKGs/Labs/Other Studies Reviewed:    The following studies were reviewed today:   EKG:  EKG is ordered today.  The ekg ordered today demonstrates sinus rhythm, rate 85, no ST/T abnormalities  Recent Labs: 02/15/2019: ALT 11; TSH 0.57 09/22/2019: BUN 13; Creatinine, Ser 1.05; Hemoglobin 12.9; Platelets 228.0; Potassium 3.6; Sodium 140  Recent Lipid Panel    Component Value Date/Time   CHOL 142 02/15/2019 1121   TRIG 99.0 02/15/2019 1121   HDL 49.00 02/15/2019 1121   CHOLHDL 3 02/15/2019 1121   VLDL 19.8 02/15/2019 1121   LDLCALC 73 02/15/2019 1121    Physical Exam:    VS:  BP 120/74   Pulse 85   Temp (!) 97.3 F (36.3 C)   Ht 5\' 7"  (1.702 m)   Wt 174 lb 3.2 oz (79 kg)   SpO2 99%   BMI 27.28 kg/m     Wt Readings from Last 3 Encounters:  10/02/19 174 lb 3.2 oz (79 kg)  09/22/19 170 lb (77.1 kg)  08/30/19 175 lb 3.2 oz (79.5 kg)     GEN:  Well nourished, well developed in no acute distress HEENT: Normal NECK: No JVD LYMPHATICS: No lymphadenopathy CARDIAC: RRR, no murmurs, rubs, gallops RESPIRATORY:  Clear to auscultation without rales, wheezing or rhonchi  ABDOMEN: Soft, non-tender, non-distended MUSCULOSKELETAL:  No edema; No deformity  SKIN: Warm and dry NEUROLOGIC:  Alert and oriented x 3 PSYCHIATRIC:  Normal affect   ASSESSMENT:    1. Chest pain, unspecified type   2. Essential hypertension   3. Hyperlipidemia, unspecified hyperlipidemia type    PLAN:    In  order of problems listed above:  Chest pain: Atypical in description.  However given his risk factors (age, hypertension) would classify as intermediate risk of obstructive CAD and warrants further evaluation -Coronary CTA  Hypertension: On amlodipine, increased to 10 mg daily on 1/22.  Also on losartan 100 mg daily.  BP 128/77 in clinic today, appears controlled  Hyperlipidemia: On pravastatin 20 mg daily.  LDL 73 on 02/15/19  ?OSA:  Seen by pulmonology, sleep study planned  RTC in 2 months  Medication Adjustments/Labs and Tests Ordered: Current medicines are reviewed at length with the patient today.  Concerns regarding medicines are outlined above.  Orders Placed This Encounter  Procedures  . CT CORONARY MORPH W/CTA COR W/SCORE W/CA W/CM &/OR WO/CM  . CT CORONARY FRACTIONAL FLOW RESERVE DATA PREP  . CT CORONARY FRACTIONAL FLOW RESERVE FLUID ANALYSIS  . Basic metabolic panel  . EKG 12-Lead   Meds ordered this encounter  Medications  . metoprolol tartrate (LOPRESSOR) 100 MG tablet    Sig: Take 100 mg (1 tablet) TWO hours prior to CT    Dispense:  1 tablet    Refill:  0    Patient Instructions  Medication Instructions:  Your physician recommends that you continue on your current medications as directed. Please refer to the Current Medication list given to you today.  *If you need a refill on your cardiac medications before your next appointment, please call your pharmacy*  Lab Work: ONE WEEK PRIOR TO CT (BMET)  If you have labs (blood work) drawn today and your tests are completely normal, you will receive your results only by: Marland Kitchen MyChart Message (if you have MyChart) OR . A paper copy in the mail If you have any lab test that is abnormal or we need to change your treatment, we will call you to review the results.  Testing/Procedures: Your physician has requested that you have cardiac CT. Cardiac computed tomography (CT) is a painless test that uses an x-ray machine to take  clear, detailed pictures of your heart. For further information please visit [email or web address] Please follow instruction sheet as given.  Follow-Up: At [facility], you and your health needs are our priority.  As part of our continuing mission to provide you with exceptional heart care, we have created designated Provider Care Teams.  These Care Teams include your primary Cardiologist (physician) and Advanced Practice Providers (APPs -  Physician Assistants and Nurse Practitioners) who all work together to provide you with the care you need, when you need it.  Your next appointment:   2 month(s)  The format for your next appointment:   In Person  Provider:   Epifanio Lesches, MD  Other Instructions Your cardiac CT will be scheduled at one of the below locations:   [facility] [address] [place], [place] [address] [phone]  OR  [facility] [address] [place], [place] [address] [phone]  If scheduled at [facility], please arrive at the [facility] main entrance of [facility] 30-45 minutes prior to test start time. Proceed to the [facility] Radiology Department (first floor) to check-in and test prep.  If scheduled at [facility], please arrive 15 mins early for check-in and test prep.  Please follow these instructions carefully (unless otherwise directed):   On the Night Before the Test: . Be sure to Drink plenty of water. . Do not consume any caffeinated/decaffeinated beverages or chocolate 12 hours prior to your test. . Do not take any antihistamines 12 hours prior to your test. .  On the Day of the Test: . Drink plenty of water. Do not drink any water within one hour of the test. . Do not eat any food 4 hours prior to the test. . You may take your regular medications prior to the test.  . Take metoprolol (Lopressor) 100 mg two hours prior to  test. . HOLD LOSARTAN AM OF CT  After the Test: . Drink plenty of water. . After receiving IV contrast, you may experience a mild flushed feeling. This is normal. . On occasion, you may experience a mild rash up to 24 hours after the test. This is not dangerous. If this occurs, you can take Benadryl 25 mg and increase your fluid intake. . If you experience trouble breathing, this can be serious. If it is severe call 911 IMMEDIATELY. If it is mild, please call our office. . If you take any of these medications: Glipizide/Metformin, Avandament, Glucavance, please do not take 48 hours after completing test unless otherwise instructed.   Once we have confirmed authorization from your insurance company, we will call you to set up a date and time for your test.   For non-scheduling related questions, please contact the cardiac imaging nurse navigator should you have any questions/concerns: Marchia Bond, RN Navigator Cardiac Imaging [facility] Heart and Vascular Services [phone] Office        Signed, Donato Heinz, MD  10/02/2019 1:49 PM    [place]

## 2019-10-02 ENCOUNTER — Ambulatory Visit: Payer: BC Managed Care – PPO | Admitting: Cardiology

## 2019-10-02 ENCOUNTER — Encounter: Payer: Self-pay | Admitting: Cardiology

## 2019-10-02 ENCOUNTER — Other Ambulatory Visit: Payer: Self-pay

## 2019-10-02 VITALS — BP 120/74 | HR 85 | Temp 97.3°F | Ht 67.0 in | Wt 174.2 lb

## 2019-10-02 DIAGNOSIS — I10 Essential (primary) hypertension: Secondary | ICD-10-CM | POA: Diagnosis not present

## 2019-10-02 DIAGNOSIS — E785 Hyperlipidemia, unspecified: Secondary | ICD-10-CM | POA: Diagnosis not present

## 2019-10-02 DIAGNOSIS — R079 Chest pain, unspecified: Secondary | ICD-10-CM

## 2019-10-02 MED ORDER — METOPROLOL TARTRATE 100 MG PO TABS: ORAL_TABLET | ORAL | 0 refills | Status: DC

## 2019-10-02 NOTE — Patient Instructions (Addendum)
Medication Instructions:  Your physician recommends that you continue on your current medications as directed. Please refer to the Current Medication list given to you today.  *If you need a refill on your cardiac medications before your next appointment, please call your pharmacy*  Lab Work: ONE WEEK PRIOR TO CT (BMET)  If you have labs (blood work) drawn today and your tests are completely normal, you will receive your results only by: Marland Kitchen MyChart Message (if you have MyChart) OR . A paper copy in the mail If you have any lab test that is abnormal or we need to change your treatment, we will call you to review the results.  Testing/Procedures: Your physician has requested that you have cardiac CT. Cardiac computed tomography (CT) is a painless test that uses an x-ray machine to take clear, detailed pictures of your heart. For further information please visit [email or web address] Please follow instruction sheet as given.  Follow-Up: At [facility], you and your health needs are our priority.  As part of our continuing mission to provide you with exceptional heart care, we have created designated Provider Care Teams.  These Care Teams include your primary Cardiologist (physician) and Advanced Practice Providers (APPs -  Physician Assistants and Nurse Practitioners) who all work together to provide you with the care you need, when you need it.  Your next appointment:   2 month(s)  The format for your next appointment:   In Person  Provider:   Epifanio Lesches, MD  Other Instructions Your cardiac CT will be scheduled at one of the below locations:   [facility] [address] [place], [place] [address] [phone]  OR  [facility] [address] [place], [place] [address] [phone]  If scheduled at [facility], please arrive at the [facility] main entrance of [facility] 30-45 minutes prior  to test start time. Proceed to the [facility] Radiology Department (first floor) to check-in and test prep.  If scheduled at [facility], please arrive 15 mins early for check-in and test prep.  Please follow these instructions carefully (unless otherwise directed):   On the Night Before the Test: . Be sure to Drink plenty of water. . Do not consume any caffeinated/decaffeinated beverages or chocolate 12 hours prior to your test. . Do not take any antihistamines 12 hours prior to your test. .  On the Day of the Test: . Drink plenty of water. Do not drink any water within one hour of the test. . Do not eat any food 4 hours prior to the test. . You may take your regular medications prior to the test.  . Take metoprolol (Lopressor) 100 mg two hours prior to test. . HOLD LOSARTAN AM OF CT  After the Test: . Drink plenty of water. . After receiving IV contrast, you may experience a mild flushed feeling. This is normal. . On occasion, you may experience a mild rash up to 24 hours after the test. This is not dangerous. If this occurs, you can take Benadryl 25 mg and increase your fluid intake. . If you experience trouble breathing, this can be serious. If it is severe call 911 IMMEDIATELY. If it is mild, please call our office. . If you take any of these medications: Glipizide/Metformin, Avandament, Glucavance, please do not take 48 hours after completing test unless otherwise instructed.   Once we have confirmed authorization from your insurance company, we will call you to  set up a date and time for your test.   For non-scheduling related questions, please contact the cardiac imaging nurse navigator should you have any questions/concerns: Marchia Bond, RN Navigator Cardiac Imaging [place] and Vascular Services [phone] Office

## 2019-10-06 ENCOUNTER — Ambulatory Visit (INDEPENDENT_AMBULATORY_CARE_PROVIDER_SITE_OTHER): Payer: BC Managed Care – PPO | Admitting: Adult Health

## 2019-10-06 ENCOUNTER — Encounter: Payer: Self-pay | Admitting: Adult Health

## 2019-10-06 DIAGNOSIS — I10 Essential (primary) hypertension: Secondary | ICD-10-CM

## 2019-10-06 MED ORDER — AMLODIPINE BESYLATE 10 MG PO TABS: 10.0000 mg | ORAL_TABLET | Freq: Every day | ORAL | 3 refills | Status: DC

## 2019-10-06 MED ORDER — AMLODIPINE BESYLATE 10 MG PO TABS: 5.0000 mg | ORAL_TABLET | Freq: Every day | ORAL | 3 refills | Status: DC

## 2019-10-06 NOTE — Progress Notes (Signed)
Subjective:    Patient ID: Marc Hancock, male    DOB: 1970-11-23, 49 y.o.   MRN: [id number]  HPI 49 year old male who  has a past medical history of Allergy, GERD (gastroesophageal reflux disease), and Hypertension.  He presents to the office today for two week follow up regarding hypertension. During his last visit Norvasc was increased to 10 mg. He has not been checking his blood pressure at home. He denies side effects.    He was seen by Cardiology on 10/02/2019 for atypical chest pain and been referred for Coronary CT. At this office visit his BP was 120/74. He reports one episode of chest pain this week     Review of Systems See HPI   Past Medical History:  Diagnosis Date  . Allergy   . GERD (gastroesophageal reflux disease)   . Hypertension     Social History   Socioeconomic History  . Marital status: Married    Spouse name: Not on file  . Number of children: Not on file  . Years of education: Not on file  . Highest education level: Not on file  Occupational History  . Not on file  Tobacco Use  . Smoking status: Passive Smoke Exposure - Never Smoker  . Smokeless tobacco: Never Used  Substance and Sexual Activity  . Alcohol use: Yes    Comment: 1-2 drinks a month  . Drug use: Yes    Types: Marijuana    Comment: marijuana 2 x daily  . Sexual activity: Yes    Partners: Female  Other Topics Concern  . Not on file  Social History Narrative   He works at [employer]    He is married    Has 6 children - Age 62 to 6    Social Determinants of Health   Financial Resource Strain:   . Difficulty of Paying Living Expenses: Not on file  Food Insecurity:   . Worried About Programme researcher, broadcasting/film/video in the Last Year: Not on file  . Ran Out of Food in the Last Year: Not on file  Transportation Needs:   . Lack of Transportation (Medical): Not on file  . Lack of Transportation (Non-Medical): Not on file  Physical Activity:   . Days of Exercise per Week: Not on file   . Minutes of Exercise per Session: Not on file  Stress:   . Feeling of Stress : Not on file  Social Connections:   . Frequency of Communication with Friends and Family: Not on file  . Frequency of Social Gatherings with Friends and Family: Not on file  . Attends Religious Services: Not on file  . Active Member of Clubs or Organizations: Not on file  . Attends Banker Meetings: Not on file  . Marital Status: Not on file  Intimate Partner Violence:   . Fear of Current or Ex-Partner: Not on file  . Emotionally Abused: Not on file  . Physically Abused: Not on file  . Sexually Abused: Not on file    Past Surgical History:  Procedure Laterality Date  . HYDROCELE EXCISION Right 04/11/2013   Procedure: RIGHT HYDROCELECTOMY ADULT;  Surgeon: Lindaann Slough, MD;  Location: [facility];  Service: Urology;  Laterality: Right;  . INGUINAL HERNIA REPAIR Right 04/11/2013   Procedure: HERNIA REPAIR INGUINAL ADULT;  Surgeon: Ernestene Mention, MD;  Location: [facility];  Service: General;  Laterality: Right;  Marland Kitchen VASECTOMY Bilateral 04/11/2013   Procedure: BILATERAL VASECTOMY;  Surgeon:  Hanley Ben, MD;  Location: [facility];  Service: Urology;  Laterality: Bilateral;    Family History  Problem Relation Age of Onset  . Alcohol abuse Other   . Arthritis Other   . Heart disease Other   . Stroke Other   . Depression Other   . Diabetes Other     No Known Allergies  Current Outpatient Medications on File Prior to Visit  Medication Sig Dispense Refill  . aspirin 81 MG EC tablet TAKE 1 TABLET BY MOUTH EVERY DAY IN THE EVENING 60 tablet 0  . losartan (COZAAR) 100 MG tablet Take 1 tablet (100 mg total) by mouth daily. 90 tablet 3  . metoprolol tartrate (LOPRESSOR) 100 MG tablet Take 100 mg (1 tablet) TWO hours prior to CT 1 tablet 0  . pantoprazole (PROTONIX) 40 MG tablet Take 1 tablet (40 mg total) by mouth daily. 90 tablet 3  . pravastatin (PRAVACHOL) 20 MG tablet Take 1 tablet (20 mg total) by  mouth daily. 90 tablet 3   No current facility-administered medications on file prior to visit.    BP 126/80   Pulse 94   Temp 98.3 F (36.8 C) (Other (Comment))   Ht 5\' 7"  (1.702 m)   Wt 175 lb (79.4 kg)   SpO2 95%   BMI 27.41 kg/m       Objective:   Physical Exam Vitals and nursing note reviewed.  Constitutional:      Appearance: Normal appearance.  Cardiovascular:     Rate and Rhythm: Normal rate and regular rhythm.     Pulses: Normal pulses.     Heart sounds: Normal heart sounds.  Pulmonary:     Effort: Pulmonary effort is normal.     Breath sounds: Normal breath sounds.  Musculoskeletal:        General: Normal range of motion.     Right lower leg: No edema.     Left lower leg: No edema.  Skin:    General: Skin is warm and dry.     Capillary Refill: Capillary refill takes less than 2 seconds.  Neurological:     General: No focal deficit present.     Mental Status: He is alert and oriented to person, place, and time.  Psychiatric:        Mood and Affect: Mood normal.        Behavior: Behavior normal.        Thought Content: Thought content normal.        Judgment: Judgment normal.        Assessment & Plan:  1. Essential hypertension - BP better controlled.  - Continue on current regimen  - amLODipine (NORVASC) 10 MG tablet; Take 1 tablet (10 mg total) by mouth daily.  Dispense: 90 tablet; Refill: 3  Dorothyann Peng, NP

## 2019-10-17 ENCOUNTER — Telehealth: Payer: Self-pay | Admitting: Adult Health

## 2019-10-17 NOTE — Telephone Encounter (Signed)
FYI

## 2019-10-17 NOTE — Telephone Encounter (Signed)
Noted  

## 2019-10-17 NOTE — Telephone Encounter (Signed)
Pt came and dropped off a Physician Results Form to be completed by provider.  Upon completion pt would like to be called at [phone].  Form placed in providers folder for completion.

## 2019-10-18 NOTE — Telephone Encounter (Signed)
Form completed. Pt has been called with update. PT stated he will pick up Friday. No further action needed. Form placed in front office filing cabinet.

## 2019-10-18 NOTE — Telephone Encounter (Signed)
Form placed on providers desk 

## 2019-11-03 ENCOUNTER — Encounter: Payer: Self-pay | Admitting: Adult Health

## 2019-11-03 ENCOUNTER — Ambulatory Visit (INDEPENDENT_AMBULATORY_CARE_PROVIDER_SITE_OTHER): Payer: BC Managed Care – PPO | Admitting: Adult Health

## 2019-11-03 ENCOUNTER — Ambulatory Visit (INDEPENDENT_AMBULATORY_CARE_PROVIDER_SITE_OTHER): Payer: BC Managed Care – PPO

## 2019-11-03 ENCOUNTER — Other Ambulatory Visit: Payer: Self-pay

## 2019-11-03 VITALS — BP 126/86 | Temp 98.9°F | Wt 176.0 lb

## 2019-11-03 DIAGNOSIS — M25512 Pain in left shoulder: Secondary | ICD-10-CM | POA: Diagnosis not present

## 2019-11-03 MED ORDER — METHYLPREDNISOLONE ACETATE 80 MG/ML IJ SUSP
80.0000 mg | Freq: Once | INTRAMUSCULAR | Status: AC
  Administered 2019-11-03: 80 mg via INTRA_ARTICULAR

## 2019-11-03 NOTE — Progress Notes (Signed)
Subjective:    Patient ID: Marc Hancock, male    DOB: [birth year], 49 y.o.   MRN: [id number]  HPI 49 year old male who  has a past medical history of Allergy, GERD (gastroesophageal reflux disease), and Hypertension.  He presents to the office today for an acute issue of left shoulder pain. He reports that the pain started over the last few months and has slowly progressing. Pain is described as " pain". Pain is worse with movement and grabbing. Pain is better with rest.   He denies trauma or aggravating injury. He has not noticed any redness,warmth, or swelling    Review of Systems See HPI   Past Medical History:  Diagnosis Date  . Allergy   . GERD (gastroesophageal reflux disease)   . Hypertension     Social History   Socioeconomic History  . Marital status: Married    Spouse name: Not on file  . Number of children: Not on file  . Years of education: Not on file  . Highest education level: Not on file  Occupational History  . Not on file  Tobacco Use  . Smoking status: Passive Smoke Exposure - Never Smoker  . Smokeless tobacco: Never Used  Substance and Sexual Activity  . Alcohol use: Yes    Comment: 1-2 drinks a month  . Drug use: Yes    Types: Marijuana    Comment: marijuana 2 x daily  . Sexual activity: Yes    Partners: Female  Other Topics Concern  . Not on file  Social History Narrative   He works at [employer]    He is married    Has 6 children - Age 38 to 6    Social Determinants of Health   Financial Resource Strain:   . Difficulty of Paying Living Expenses: Not on file  Food Insecurity:   . Worried About Programme researcher, broadcasting/film/video in the Last Year: Not on file  . Ran Out of Food in the Last Year: Not on file  Transportation Needs:   . Lack of Transportation (Medical): Not on file  . Lack of Transportation (Non-Medical): Not on file  Physical Activity:   . Days of Exercise per Week: Not on file  . Minutes of Exercise per Session: Not on file    Stress:   . Feeling of Stress : Not on file  Social Connections:   . Frequency of Communication with Friends and Family: Not on file  . Frequency of Social Gatherings with Friends and Family: Not on file  . Attends Religious Services: Not on file  . Active Member of Clubs or Organizations: Not on file  . Attends Banker Meetings: Not on file  . Marital Status: Not on file  Intimate Partner Violence:   . Fear of Current or Ex-Partner: Not on file  . Emotionally Abused: Not on file  . Physically Abused: Not on file  . Sexually Abused: Not on file    Past Surgical History:  Procedure Laterality Date  . HYDROCELE EXCISION Right 04/11/2013   Procedure: RIGHT HYDROCELECTOMY ADULT;  Surgeon: Lindaann Slough, MD;  Location: [facility];  Service: Urology;  Laterality: Right;  . INGUINAL HERNIA REPAIR Right 04/11/2013   Procedure: HERNIA REPAIR INGUINAL ADULT;  Surgeon: Ernestene Mention, MD;  Location: [facility];  Service: General;  Laterality: Right;  Marland Kitchen VASECTOMY Bilateral 04/11/2013   Procedure: BILATERAL VASECTOMY;  Surgeon: Lindaann Slough, MD;  Location: [facility];  Service: Urology;  Laterality: Bilateral;    Family History  Problem Relation Age of Onset  . Alcohol abuse Other   . Arthritis Other   . Heart disease Other   . Stroke Other   . Depression Other   . Diabetes Other     No Known Allergies  Current Outpatient Medications on File Prior to Visit  Medication Sig Dispense Refill  . amLODipine (NORVASC) 10 MG tablet Take 1 tablet (10 mg total) by mouth daily. 90 tablet 3  . aspirin 81 MG EC tablet TAKE 1 TABLET BY MOUTH EVERY DAY IN THE EVENING 60 tablet 0  . losartan (COZAAR) 100 MG tablet Take 1 tablet (100 mg total) by mouth daily. 90 tablet 3  . metoprolol tartrate (LOPRESSOR) 100 MG tablet Take 100 mg (1 tablet) TWO hours prior to CT 1 tablet 0  . pantoprazole (PROTONIX) 40 MG tablet Take 1 tablet (40 mg total) by mouth daily. 90 tablet 3  . pravastatin (PRAVACHOL)  20 MG tablet Take 1 tablet (20 mg total) by mouth daily. 90 tablet 3   No current facility-administered medications on file prior to visit.    BP 126/86   Temp 98.9 F (37.2 C) (Temporal)   Wt 176 lb (79.8 kg)   BMI 27.57 kg/m       Objective:   Physical Exam Vitals and nursing note reviewed.  Constitutional:      Appearance: Normal appearance.  Cardiovascular:     Rate and Rhythm: Normal rate and regular rhythm.     Pulses: Normal pulses.     Heart sounds: Normal heart sounds.  Musculoskeletal:        General: No swelling or tenderness.     Right shoulder: Normal.     Left shoulder: No swelling, deformity, effusion, laceration, tenderness, bony tenderness or crepitus. Decreased range of motion. Normal strength. Normal pulse.     Comments: He was able to perform back scratch test without difficulty.  Had some pain with external rotation but no pain with abduction.  Jobe test negative.  Discomfort with drop arm test  Skin:    General: Skin is warm and dry.  Neurological:     General: No focal deficit present.     Mental Status: He is alert and oriented to person, place, and time.        Assessment & Plan:  1. Acute pain of left shoulder -Possible rotator cuff injury.  Do not believe this is skeletal in nature.  We discussed options including sports medicine/orthopedics and MRI.  He opted for steroid injection to see if he gets relief from that and will get an x-ray today.  He was advised to follow-up if no improvement in the next week and at that time we can look at referral Shoulder injection Verbal consent obtained and verified. Sterile betadine prep. Furthur cleansed with alcohol. Topical analgesic spray: Ethyl chloride. Joint: left shoulder subacromial injection Approached in typical fashion with: posterior approach Completed without difficulty Meds: 3 cc lidocaine 2% no epi, 1 cc depomedrol 80mg /cc Needle:1.5 inch 25 gauge Aftercare instructions and Red flags  advised. Immediate improvement in pain noted  - DG Shoulder Left; Future - methylPREDNISolone acetate (DEPO-MEDROL) injection 80 mg   Dorothyann Peng, NP

## 2019-11-08 DIAGNOSIS — R079 Chest pain, unspecified: Secondary | ICD-10-CM | POA: Diagnosis not present

## 2019-11-08 LAB — BASIC METABOLIC PANEL
BUN/Creatinine Ratio: 12 (ref 9–20)
BUN: 15 mg/dL (ref 6–24)
CO2: 25 mmol/L (ref 20–29)
Calcium: 9.3 mg/dL (ref 8.7–10.2)
Chloride: 102 mmol/L (ref 96–106)
Creatinine, Ser: 1.22 mg/dL (ref 0.76–1.27)
GFR calc Af Amer: 79 mL/min/{1.73_m2} (ref >59)
GFR calc non Af Amer: 69 mL/min/{1.73_m2} (ref >59)
Glucose: 88 mg/dL (ref 65–99)
Potassium: 4.5 mmol/L (ref 3.5–5.2)
Sodium: 140 mmol/L (ref 134–144)

## 2019-11-15 DIAGNOSIS — R41840 Attention and concentration deficit: Secondary | ICD-10-CM | POA: Diagnosis not present

## 2019-11-15 DIAGNOSIS — L309 Dermatitis, unspecified: Secondary | ICD-10-CM | POA: Diagnosis not present

## 2019-11-15 DIAGNOSIS — L819 Disorder of pigmentation, unspecified: Secondary | ICD-10-CM | POA: Diagnosis not present

## 2019-11-16 ENCOUNTER — Encounter (HOSPITAL_COMMUNITY): Payer: Self-pay

## 2019-11-16 ENCOUNTER — Telehealth (HOSPITAL_COMMUNITY): Payer: Self-pay | Admitting: Emergency Medicine

## 2019-11-16 NOTE — Telephone Encounter (Signed)
VM box full, cannot leave message  Rockwell Alexandria RN Navigator Cardiac Imaging [facility] [phone] Office  [phone] Cell

## 2019-11-17 ENCOUNTER — Ambulatory Visit (HOSPITAL_COMMUNITY)
Admission: RE | Admit: 2019-11-17 | Discharge: 2019-11-17 | Disposition: A | Discharge status: Home / Self Care | Payer: BC Managed Care – PPO | Source: Ambulatory Visit | Attending: Cardiology | Admitting: Cardiology

## 2019-11-17 ENCOUNTER — Other Ambulatory Visit: Payer: Self-pay

## 2019-11-17 DIAGNOSIS — R079 Chest pain, unspecified: Secondary | ICD-10-CM | POA: Insufficient documentation

## 2019-11-17 MED ORDER — NITROGLYCERIN 0.4 MG SL SUBL
SUBLINGUAL_TABLET | SUBLINGUAL | Status: AC
  Filled 2019-11-17: qty 2

## 2019-11-17 MED ORDER — IOHEXOL 350 MG/ML SOLN
80.0000 mL | Freq: Once | INTRAVENOUS | Status: AC | PRN
  Administered 2019-11-17: 80 mL via INTRAVENOUS

## 2019-11-17 MED ORDER — NITROGLYCERIN 0.4 MG SL SUBL
0.8000 mg | SUBLINGUAL_TABLET | Freq: Once | SUBLINGUAL | Status: AC
  Administered 2019-11-17: 0.8 mg via SUBLINGUAL

## 2019-12-05 NOTE — Progress Notes (Deleted)
Cardiology Office Note:    Date:  12/05/2019   ID:  Marc Hancock, DOB Jan 09, 1971, MRN [id number]  PCP:  Marc Peng, NP  Cardiologist:  No primary care provider on file.  Electrophysiologist:  None   Referring MD: Marc Peng, NP   No chief complaint on file.   History of Present Illness:    Marc Hancock is a 49 y.o. male with a hx of GERD, hypertension who presents for follow-up.  He was referred by Marc Peng, NP for an evaluation of chest pain, initially seen on 10/02/2019.  Reports chest pain started 2 months prior.  States that it occurs across his whole chest.  Describes as tightness in chest, 6-7/10 in intensity.  Has been occurring about every 2 weeks or so.  Last couple of minutes and resolves spontaneously.  Has not noted what brings it on.  He does not think there is any relationship with exertion, but does think it occurs more frequently during times of stress.  No smoking history.  Denies any heart disease in his immediate family.  Reports that he has not been exercising since the pandemic started.  Prior to this was playing basketball at the [employer].  Reports BP has not been well controlled, recently increased amlodipine to 10 mg daily and BP has been much better controlled since that time.  Coronary CTA on 11/17/2018 showed minimal noncalcified, nonobstructive plaque in the mid LAD.  Calcium score 0.  Past Medical History:  Diagnosis Date  . Allergy   . GERD (gastroesophageal reflux disease)   . Hypertension     Past Surgical History:  Procedure Laterality Date  . HYDROCELE EXCISION Right 04/11/2013   Procedure: RIGHT HYDROCELECTOMY ADULT;  Surgeon: Marc Ben, MD;  Location: [facility];  Service: Urology;  Laterality: Right;  . INGUINAL HERNIA REPAIR Right 04/11/2013   Procedure: HERNIA REPAIR INGUINAL ADULT;  Surgeon: Marc Hector, MD;  Location: [facility];  Service: General;  Laterality: Right;  Marc Hancock VASECTOMY Bilateral 04/11/2013   Procedure: BILATERAL VASECTOMY;   Surgeon: Marc Ben, MD;  Location: [facility];  Service: Urology;  Laterality: Bilateral;    Current Medications: No outpatient medications have been marked as taking for the 12/07/19 encounter (Appointment) with Marc Heinz, MD.     Allergies:   Patient has no known allergies.   Social History   Socioeconomic History  . Marital status: Married    Spouse name: Not on file  . Number of children: Not on file  . Years of education: Not on file  . Highest education level: Not on file  Occupational History  . Not on file  Tobacco Use  . Smoking status: Passive Smoke Exposure - Never Smoker  . Smokeless tobacco: Never Used  Substance and Sexual Activity  . Alcohol use: Yes    Comment: 1-2 drinks a month  . Drug use: Yes    Types: Marijuana    Comment: marijuana 2 x daily  . Sexual activity: Yes    Partners: Female  Other Topics Concern  . Not on file  Social History Narrative   He works at [employer]    He is married    Has 6 children - Age 45 to 53    Social Determinants of Health   Financial Resource Strain:   . Difficulty of Paying Living Expenses:   Food Insecurity:   . Worried About Charity fundraiser in the Last Year:   . [place] in the  Last Year:   Transportation Needs:   . Freight forwarder (Medical):   Marc Hancock Lack of Transportation (Non-Medical):   Physical Activity:   . Days of Exercise per Week:   . Minutes of Exercise per Session:   Stress:   . Feeling of Stress :   Social Connections:   . Frequency of Communication with Friends and Family:   . Frequency of Social Gatherings with Friends and Family:   . Attends Religious Services:   . Active Member of Clubs or Organizations:   . Attends Banker Meetings:   Marc Hancock Marital Status:      Family History: The patient's family history includes Alcohol abuse in an other family member; Arthritis in an other family member; Depression in an other family member; Diabetes in  an other family member; Heart disease in an other family member; Stroke in an other family member.  ROS:   Please see the history of present illness.     All other systems reviewed and are negative.  EKGs/Labs/Other Studies Reviewed:    The following studies were reviewed today:   EKG:  EKG is not ordered today.  The ekg ordered most recently demonstrates sinus rhythm, rate 85, no ST/T abnormalities  Coronary CTA 11/17/2019: 1. Coronary calcium score of 0. 2. Normal coronary origin with right dominance. 3. Minimal (<25%) non-calcified, non-obstructive plaque in the mid LAD.  RECOMMENDATIONS: 1. Minimal non-calcified plaque in the mid LAD. Consider non-atherosclerotic causes of chest pain. Consider risk factor Modification.  IMPRESSION: 1.  No acute findings in the imaged extracardiac chest. 2.  Aortic Atherosclerosis (ICD10-I70.0).  Recent Labs: 02/15/2019: ALT 11; TSH 0.57 09/22/2019: Hemoglobin 12.9; Platelets 228.0 11/08/2019: BUN 15; Creatinine, Ser 1.22; Potassium 4.5; Sodium 140  Recent Lipid Panel    Component Value Date/Time   CHOL 142 02/15/2019 1121   TRIG 99.0 02/15/2019 1121   HDL 49.00 02/15/2019 1121   CHOLHDL 3 02/15/2019 1121   VLDL 19.8 02/15/2019 1121   LDLCALC 73 02/15/2019 1121    Physical Exam:    VS:  There were no vitals taken for this visit.    Wt Readings from Last 3 Encounters:  11/03/19 176 lb (79.8 kg)  10/06/19 175 lb (79.4 kg)  10/02/19 174 lb 3.2 oz (79 kg)     GEN:  Well nourished, well developed in no acute distress HEENT: Normal NECK: No JVD LYMPHATICS: No lymphadenopathy CARDIAC: RRR, no murmurs, rubs, gallops RESPIRATORY:  Clear to auscultation without rales, wheezing or rhonchi  ABDOMEN: Soft, non-tender, non-distended MUSCULOSKELETAL:  No edema; No deformity  SKIN: Warm and dry NEUROLOGIC:  Alert and oriented x 3 PSYCHIATRIC:  Normal affect   ASSESSMENT:    No diagnosis found. PLAN:    In order of problems listed  above:  Chest pain: Atypical in description.  Nonobstructive CAD on coronary CTA  Hypertension: On amlodipine 10 mg daily and losartan 100 mg daily  Hyperlipidemia: On pravastatin 20 mg daily.  LDL 73 on 02/15/19  ?OSA: Seen by pulmonology, sleep study planned  RTC in 2 months  Medication Adjustments/Labs and Tests Ordered: Current medicines are reviewed at length with the patient today.  Concerns regarding medicines are outlined above.  No orders of the defined types were placed in this encounter.  No orders of the defined types were placed in this encounter.   There are no Patient Instructions on file for this visit.   Signed, Little Ishikawa, MD  12/05/2019 4:23 PM    [facility]

## 2019-12-07 ENCOUNTER — Ambulatory Visit: Payer: BC Managed Care – PPO | Admitting: Cardiology

## 2020-02-23 ENCOUNTER — Encounter: Payer: BC Managed Care – PPO | Admitting: Adult Health

## 2020-02-29 ENCOUNTER — Encounter: Payer: Self-pay | Admitting: Adult Health

## 2020-02-29 ENCOUNTER — Other Ambulatory Visit: Payer: Self-pay

## 2020-02-29 ENCOUNTER — Ambulatory Visit (INDEPENDENT_AMBULATORY_CARE_PROVIDER_SITE_OTHER): Payer: BC Managed Care – PPO | Admitting: Adult Health

## 2020-02-29 VITALS — BP 140/90 | HR 76 | Temp 97.1°F | Ht 67.0 in | Wt 172.5 lb

## 2020-02-29 DIAGNOSIS — Z125 Encounter for screening for malignant neoplasm of prostate: Secondary | ICD-10-CM

## 2020-02-29 DIAGNOSIS — Z0000 Encounter for general adult medical examination without abnormal findings: Secondary | ICD-10-CM

## 2020-02-29 DIAGNOSIS — E782 Mixed hyperlipidemia: Secondary | ICD-10-CM

## 2020-02-29 DIAGNOSIS — R748 Abnormal levels of other serum enzymes: Secondary | ICD-10-CM

## 2020-02-29 DIAGNOSIS — I10 Essential (primary) hypertension: Secondary | ICD-10-CM

## 2020-02-29 DIAGNOSIS — Z1159 Encounter for screening for other viral diseases: Secondary | ICD-10-CM

## 2020-02-29 DIAGNOSIS — Z1211 Encounter for screening for malignant neoplasm of colon: Secondary | ICD-10-CM

## 2020-02-29 DIAGNOSIS — K2100 Gastro-esophageal reflux disease with esophagitis, without bleeding: Secondary | ICD-10-CM

## 2020-02-29 LAB — CBC WITH DIFFERENTIAL/PLATELET
Basophils Absolute: 0 10*3/uL (ref 0.0–0.1)
Basophils Relative: 0.6 % (ref 0.0–3.0)
Eosinophils Absolute: 0.2 10*3/uL (ref 0.0–0.7)
Eosinophils Relative: 2.5 % (ref 0.0–5.0)
HCT: 36.4 % — ABNORMAL LOW (ref 39.0–52.0)
Hemoglobin: 12.2 g/dL — ABNORMAL LOW (ref 13.0–17.0)
Lymphocytes Relative: 38.8 % (ref 12.0–46.0)
Lymphs Abs: 2.8 10*3/uL (ref 0.7–4.0)
MCHC: 33.5 g/dL (ref 30.0–36.0)
MCV: 91.5 fl (ref 78.0–100.0)
Monocytes Absolute: 0.5 10*3/uL (ref 0.1–1.0)
Monocytes Relative: 7.5 % (ref 3.0–12.0)
Neutro Abs: 3.7 10*3/uL (ref 1.4–7.7)
Neutrophils Relative %: 50.6 % (ref 43.0–77.0)
Platelets: 209 10*3/uL (ref 150.0–400.0)
RBC: 3.98 Mil/uL — ABNORMAL LOW (ref 4.22–5.81)
RDW: 13.3 % (ref 11.5–15.5)
WBC: 7.3 10*3/uL (ref 4.0–10.5)

## 2020-02-29 LAB — COMPREHENSIVE METABOLIC PANEL
ALT: 75 U/L — ABNORMAL HIGH (ref 0–53)
AST: 54 U/L — ABNORMAL HIGH (ref 0–37)
Albumin: 4.2 g/dL (ref 3.5–5.2)
Alkaline Phosphatase: 111 U/L (ref 39–117)
BUN: 12 mg/dL (ref 6–23)
CO2: 31 mEq/L (ref 19–32)
Calcium: 9.5 mg/dL (ref 8.4–10.5)
Chloride: 104 mEq/L (ref 96–112)
Creatinine, Ser: 1.11 mg/dL (ref 0.40–1.50)
GFR: 84.58 mL/min (ref >60.00)
Glucose, Bld: 93 mg/dL (ref 70–99)
Potassium: 4.2 mEq/L (ref 3.5–5.1)
Sodium: 140 mEq/L (ref 135–145)
Total Bilirubin: 0.6 mg/dL (ref 0.2–1.2)
Total Protein: 6.9 g/dL (ref 6.0–8.3)

## 2020-02-29 LAB — LIPID PANEL
Cholesterol: 151 mg/dL (ref 0–200)
HDL: 53.6 mg/dL (ref >39.00)
LDL Cholesterol: 83 mg/dL (ref 0–99)
NonHDL: 97.52
Total CHOL/HDL Ratio: 3
Triglycerides: 72 mg/dL (ref 0.0–149.0)
VLDL: 14.4 mg/dL (ref 0.0–40.0)

## 2020-02-29 LAB — TSH: TSH: 1.13 u[IU]/mL (ref 0.35–4.50)

## 2020-02-29 LAB — PSA: PSA: 1.23 ng/mL (ref 0.10–4.00)

## 2020-02-29 LAB — HEMOGLOBIN A1C: Hgb A1c MFr Bld: 5.6 % (ref 4.6–6.5)

## 2020-02-29 NOTE — Progress Notes (Signed)
Subjective:    Patient ID: Marc Hancock, male    DOB: 11/19/1970, 49 y.o.   MRN: [id number]  HPI Patient presents for yearly preventative medicine examination. He is a pleasant 49 year old male who  has a past medical history of Allergy, GERD (gastroesophageal reflux disease), and Hypertension.  Essential Hypertension -well controlled with Norvasc 10 mg and losartan 100 mg daily.  He does monitor his blood pressures at home periodically and reports that most of his readings are in the 120s over 80 range.  He denies dizziness, lightheadedness, chest pain, or shortness of breath  BP Readings from Last 3 Encounters:  02/29/20 140/90  11/17/19 108/67  11/03/19 126/86   Hyperlipidemia -currently prescribed pravastatin 20 mg daily.  He denies myalgia or fatigue  Lab Results  Component Value Date   CHOL 142 02/15/2019   HDL 49.00 02/15/2019   LDLCALC 73 02/15/2019   TRIG 99.0 02/15/2019   CHOLHDL 3 02/15/2019   GERD -ports symptoms are well controlled with Protonix  All immunizations and health maintenance protocols were reviewed with the patient and needed orders were placed.  Appropriate screening laboratory values were ordered for the patient including screening of hyperlipidemia, renal function and hepatic function. If indicated by BPH, a PSA was ordered.  Medication reconciliation,  past medical history, social history, problem list and allergies were reviewed in detail with the patient  Goals were established with regard to weight loss, exercise, and  diet in compliance with medications. He is active with his job but does not do any formal exercise.   Wt Readings from Last 3 Encounters:  02/29/20 172 lb 8 oz (78.2 kg)  11/03/19 176 lb (79.8 kg)  10/06/19 175 lb (79.4 kg)    Review of Systems  Constitutional: Negative.   HENT: Negative.   Eyes: Negative.   Respiratory: Negative.   Cardiovascular: Negative.   Gastrointestinal: Negative.   Endocrine: Negative.     Genitourinary: Negative.   Musculoskeletal: Negative.   Skin: Negative.   Allergic/Immunologic: Negative.   Neurological: Negative.   Hematological: Negative.   Psychiatric/Behavioral: Negative.   All other systems reviewed and are negative.  Past Medical History:  Diagnosis Date  . Allergy   . GERD (gastroesophageal reflux disease)   . Hypertension     Social History   Socioeconomic History  . Marital status: Married    Spouse name: Not on file  . Number of children: Not on file  . Years of education: Not on file  . Highest education level: Not on file  Occupational History  . Not on file  Tobacco Use  . Smoking status: Passive Smoke Exposure - Never Smoker  . Smokeless tobacco: Never Used  Substance and Sexual Activity  . Alcohol use: Yes    Comment: 1-2 drinks a month  . Drug use: Yes    Types: Marijuana    Comment: marijuana 2 x daily  . Sexual activity: Yes    Partners: Female  Other Topics Concern  . Not on file  Social History Narrative   He works at [employer]    He is married    Has 6 children - Age 60 to 6    Social Determinants of Health   Financial Resource Strain:   . Difficulty of Paying Living Expenses:   Food Insecurity:   . Worried About Programme researcher, broadcasting/film/video in the Last Year:   . Barista in the Last Year:   Transportation Needs:   .  Lack of Transportation (Medical):   Marland Kitchen Lack of Transportation (Non-Medical):   Physical Activity:   . Days of Exercise per Week:   . Minutes of Exercise per Session:   Stress:   . Feeling of Stress :   Social Connections:   . Frequency of Communication with Friends and Family:   . Frequency of Social Gatherings with Friends and Family:   . Attends Religious Services:   . Active Member of Clubs or Organizations:   . Attends Banker Meetings:   Marland Kitchen Marital Status:   Intimate Partner Violence:   . Fear of Current or Ex-Partner:   . Emotionally Abused:   Marland Kitchen Physically Abused:   .  Sexually Abused:     Past Surgical History:  Procedure Laterality Date  . HYDROCELE EXCISION Right 04/11/2013   Procedure: RIGHT HYDROCELECTOMY ADULT;  Surgeon: Lindaann Slough, MD;  Location: [facility];  Service: Urology;  Laterality: Right;  . INGUINAL HERNIA REPAIR Right 04/11/2013   Procedure: HERNIA REPAIR INGUINAL ADULT;  Surgeon: Ernestene Mention, MD;  Location: [facility];  Service: General;  Laterality: Right;  Marland Kitchen VASECTOMY Bilateral 04/11/2013   Procedure: BILATERAL VASECTOMY;  Surgeon: Lindaann Slough, MD;  Location: [facility];  Service: Urology;  Laterality: Bilateral;    Family History  Problem Relation Age of Onset  . Alcohol abuse Other   . Arthritis Other   . Heart disease Other   . Stroke Other   . Depression Other   . Diabetes Other     No Known Allergies  Current Outpatient Medications on File Prior to Visit  Medication Sig Dispense Refill  . amLODipine (NORVASC) 10 MG tablet Take 1 tablet (10 mg total) by mouth daily. 90 tablet 3  . aspirin 81 MG EC tablet TAKE 1 TABLET BY MOUTH EVERY DAY IN THE EVENING 60 tablet 0  . losartan (COZAAR) 100 MG tablet Take 1 tablet (100 mg total) by mouth daily. 90 tablet 3  . pantoprazole (PROTONIX) 40 MG tablet Take 1 tablet (40 mg total) by mouth daily. 90 tablet 3  . pravastatin (PRAVACHOL) 20 MG tablet Take 1 tablet (20 mg total) by mouth daily. 90 tablet 3   No current facility-administered medications on file prior to visit.    BP 140/90 (BP Location: Left Arm, Patient Position: Sitting, Cuff Size: Normal)   Pulse 76   Temp (!) 97.1 F (36.2 C) (Temporal)   Ht 5\' 7"  (1.702 m)   Wt 172 lb 8 oz (78.2 kg)   SpO2 98%   BMI 27.02 kg/m       Objective:   Physical Exam Vitals and nursing note reviewed.  Constitutional:      General: He is not in acute distress.    Appearance: Normal appearance. He is well-developed and normal weight.  HENT:     Head: Normocephalic and atraumatic.     Right Ear: Tympanic membrane, ear canal  and external ear normal. There is no impacted cerumen.     Left Ear: Tympanic membrane, ear canal and external ear normal. There is no impacted cerumen.     Nose: Nose normal. No congestion or rhinorrhea.     Mouth/Throat:     Mouth: Mucous membranes are moist.     Pharynx: Oropharynx is clear. No oropharyngeal exudate or posterior oropharyngeal erythema.  Eyes:     General:        Right eye: No discharge.        Left eye: No discharge.  Extraocular Movements: Extraocular movements intact.     Conjunctiva/sclera: Conjunctivae normal.     Pupils: Pupils are equal, round, and reactive to light.  Neck:     Vascular: No carotid bruit.     Trachea: No tracheal deviation.  Cardiovascular:     Rate and Rhythm: Normal rate and regular rhythm.     Pulses: Normal pulses.     Heart sounds: Normal heart sounds. No murmur heard.  No friction rub. No gallop.   Pulmonary:     Effort: Pulmonary effort is normal. No respiratory distress.     Breath sounds: Normal breath sounds. No stridor. No wheezing, rhonchi or rales.  Chest:     Chest wall: No tenderness.  Abdominal:     General: Bowel sounds are normal. There is no distension.     Palpations: Abdomen is soft. There is no mass.     Tenderness: There is no abdominal tenderness. There is no right CVA tenderness, left CVA tenderness, guarding or rebound.     Hernia: No hernia is present.  Musculoskeletal:        General: No swelling, tenderness, deformity or signs of injury. Normal range of motion.     Right lower leg: No edema.     Left lower leg: No edema.  Lymphadenopathy:     Cervical: No cervical adenopathy.  Skin:    General: Skin is warm and dry.     Capillary Refill: Capillary refill takes less than 2 seconds.     Coloration: Skin is not jaundiced or pale.     Findings: No bruising, erythema, lesion or rash.  Neurological:     General: No focal deficit present.     Mental Status: He is alert and oriented to person, place, and  time.     Cranial Nerves: No cranial nerve deficit.     Sensory: No sensory deficit.     Motor: No weakness.     Coordination: Coordination normal.     Gait: Gait normal.     Deep Tendon Reflexes: Reflexes normal.  Psychiatric:        Mood and Affect: Mood normal.        Behavior: Behavior normal.        Thought Content: Thought content normal.        Judgment: Judgment normal.       Assessment & Plan:  1. Routine general medical examination at a health care facility - Encouraged heart healthy diet and exercise outside of work  - Follow up in one year or sooner if needed  - CBC with Differential/Platelet - Comprehensive metabolic panel - Hemoglobin A1c - Lipid panel - TSH  2. Essential hypertension - BP elevated today but he did not take his meds prior to coming in  - Continue with current medications  - CBC with Differential/Platelet - Comprehensive metabolic panel - Hemoglobin A1c - Lipid panel - TSH  3. Mixed hyperlipidemia - Consider increase in statin  - CBC with Differential/Platelet - Comprehensive metabolic panel - Hemoglobin A1c - Lipid panel - TSH  4. Prostate cancer screening  - PSA  5. Gastroesophageal reflux disease with esophagitis without hemorrhage - Continue with protonix   6. Need for hepatitis C screening test  - Hep C Antibody  7. Screening for malignant neoplasm of colon  - Ambulatory referral to Gastroenterology   Shirline Frees, NP

## 2020-02-29 NOTE — Patient Instructions (Signed)
It was great seeing you today   We will follow up with you regarding your blood work   Someone will call you to schedule your colonoscopy   Please call [facility] to schedule your home sleep study   Phone: [phone]

## 2020-03-01 LAB — HEPATITIS C ANTIBODY
Hepatitis C Ab: NONREACTIVE
SIGNAL TO CUT-OFF: 0.03 (ref <1.00)

## 2020-03-06 ENCOUNTER — Other Ambulatory Visit: Payer: Self-pay | Admitting: Adult Health

## 2020-03-06 DIAGNOSIS — I10 Essential (primary) hypertension: Secondary | ICD-10-CM

## 2020-03-06 DIAGNOSIS — E782 Mixed hyperlipidemia: Secondary | ICD-10-CM

## 2020-03-06 NOTE — Telephone Encounter (Signed)
SENT TO THE PHARMACY BY E-SCRIBE. 

## 2020-03-07 ENCOUNTER — Encounter: Payer: Self-pay | Admitting: Internal Medicine

## 2020-05-03 ENCOUNTER — Encounter: Payer: BC Managed Care – PPO | Admitting: Internal Medicine

## 2020-05-28 ENCOUNTER — Other Ambulatory Visit: Payer: BC Managed Care – PPO

## 2020-07-11 DIAGNOSIS — Z23 Encounter for immunization: Secondary | ICD-10-CM | POA: Diagnosis not present

## 2020-10-10 ENCOUNTER — Telehealth: Payer: Self-pay | Admitting: Adult Health

## 2020-10-10 NOTE — Telephone Encounter (Signed)
Form received and placed in Marc Hancock's folder for signature. 

## 2020-10-10 NOTE — Telephone Encounter (Signed)
Physician Results Form to be filled out--placed in dr's folder.  Call [phone] upon completion.

## 2020-10-11 NOTE — Telephone Encounter (Signed)
Pt notified to pick up at the front desk.  Nothing further needed. 

## 2020-11-13 ENCOUNTER — Other Ambulatory Visit: Payer: Self-pay | Admitting: Adult Health

## 2020-11-13 ENCOUNTER — Telehealth: Payer: Self-pay

## 2020-11-13 DIAGNOSIS — I10 Essential (primary) hypertension: Secondary | ICD-10-CM

## 2020-11-13 NOTE — Telephone Encounter (Signed)
I called the patient and informed him that a 30 day supply of his Amlodipine was sent to his pharmacy and he needed to schedule an appointment, Patient stated he would call back to schedule a f/up with the provider he was driving.  Nina,cma

## 2020-12-10 ENCOUNTER — Other Ambulatory Visit: Payer: Self-pay | Admitting: Adult Health

## 2020-12-10 DIAGNOSIS — I10 Essential (primary) hypertension: Secondary | ICD-10-CM

## 2021-02-20 ENCOUNTER — Emergency Department (HOSPITAL_BASED_OUTPATIENT_CLINIC_OR_DEPARTMENT_OTHER)
Admission: EM | Admit: 2021-02-20 | Discharge: 2021-02-20 | Disposition: A | Discharge status: Home / Self Care | Payer: BC Managed Care – PPO | Attending: Emergency Medicine | Admitting: Emergency Medicine

## 2021-02-20 ENCOUNTER — Other Ambulatory Visit: Payer: Self-pay

## 2021-02-20 ENCOUNTER — Encounter (HOSPITAL_BASED_OUTPATIENT_CLINIC_OR_DEPARTMENT_OTHER): Payer: Self-pay | Admitting: *Deleted

## 2021-02-20 DIAGNOSIS — M542 Cervicalgia: Secondary | ICD-10-CM | POA: Insufficient documentation

## 2021-02-20 DIAGNOSIS — S46812A Strain of other muscles, fascia and tendons at shoulder and upper arm level, left arm, initial encounter: Secondary | ICD-10-CM | POA: Diagnosis not present

## 2021-02-20 DIAGNOSIS — Z7982 Long term (current) use of aspirin: Secondary | ICD-10-CM | POA: Diagnosis not present

## 2021-02-20 DIAGNOSIS — Z79899 Other long term (current) drug therapy: Secondary | ICD-10-CM | POA: Diagnosis not present

## 2021-02-20 DIAGNOSIS — R519 Headache, unspecified: Secondary | ICD-10-CM | POA: Diagnosis not present

## 2021-02-20 DIAGNOSIS — I10 Essential (primary) hypertension: Secondary | ICD-10-CM | POA: Diagnosis not present

## 2021-02-20 DIAGNOSIS — S4992XA Unspecified injury of left shoulder and upper arm, initial encounter: Secondary | ICD-10-CM | POA: Diagnosis not present

## 2021-02-20 DIAGNOSIS — Y92410 Unspecified street and highway as the place of occurrence of the external cause: Secondary | ICD-10-CM | POA: Diagnosis not present

## 2021-02-20 NOTE — Discharge Instructions (Addendum)
If you develop continued, recurrent, or worsening headache, neck pai, neck stiffness, vomiting, blurry or double vision, weakness or numbness in your arms or legs, trouble speaking, or any other new/concerning symptoms then return to the ER for evaluation.
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
## 2021-02-24 ENCOUNTER — Other Ambulatory Visit: Payer: Self-pay | Admitting: Adult Health

## 2021-02-24 ENCOUNTER — Other Ambulatory Visit: Payer: Self-pay

## 2021-02-24 DIAGNOSIS — I10 Essential (primary) hypertension: Secondary | ICD-10-CM

## 2021-02-24 MED ORDER — AMLODIPINE BESYLATE 10 MG PO TABS: 10.0000 mg | ORAL_TABLET | Freq: Every day | ORAL | 0 refills | Status: DC

## 2021-02-25 ENCOUNTER — Encounter: Payer: Self-pay | Admitting: Family Medicine

## 2021-02-25 ENCOUNTER — Ambulatory Visit: Payer: BC Managed Care – PPO | Admitting: Family Medicine

## 2021-02-25 VITALS — BP 148/90 | HR 72 | Temp 98.3°F | Wt 180.2 lb

## 2021-02-25 DIAGNOSIS — S161XXD Strain of muscle, fascia and tendon at neck level, subsequent encounter: Secondary | ICD-10-CM | POA: Diagnosis not present

## 2021-02-25 MED ORDER — METHOCARBAMOL 500 MG PO TABS: 500.0000 mg | ORAL_TABLET | Freq: Four times a day (QID) | ORAL | 0 refills | Status: DC | PRN

## 2021-02-25 MED ORDER — PREDNISONE 10 MG PO TABS: ORAL_TABLET | ORAL | 0 refills | Status: DC
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
## 2021-02-26 ENCOUNTER — Encounter: Payer: Self-pay | Admitting: Adult Health

## 2021-02-27 NOTE — Telephone Encounter (Signed)
Should this come from Dr.Fry?

## 2021-02-28 NOTE — Telephone Encounter (Signed)
This has been taking care of. Pt notified of update. 

## 2021-04-05 IMAGING — CT CT HEART MORP W/ CTA COR W/ SCORE W/ CA W/CM &/OR W/O CM
4 of 7 series · 8 of 20 positions shown, 9 images · IV contrast (APPLIED)
Comparison: Chest radiograph 09/22/2019.
COMPARISON: Chest radiograph 09/22/2019.

Addendum:
EXAM:
OVER-READ INTERPRETATION  CT CHEST

The following report is an over-read performed by radiologist Dr.
Meiltrine Arleta [REDACTED] on 11/17/2019. This over-read
does not include interpretation of cardiac or coronary anatomy or
pathology. The coronary CTA interpretation by the cardiologist is
attached.
CLINICAL DATA: Chest pain
Cardiac/Coronary CTA
TECHNIQUE: The patient was scanned on a Phillips Force scanner. A 100 kV
prospective scan was triggered in the descending thoracic aorta at
111 HU's. Axial non-contrast 3 mm slices were carried out through
the heart. The data set was analyzed on a dedicated work station and
scored using the Agatson method. Gantry rotation speed was 250 msecs
and collimation was .6 mm. No beta blockade and 0.8 mg of sl NTG was
given. The 3D data set was reconstructed in 5% intervals of the
35-75 % of the R-R cycle. Diastolic phases were analyzed on a
dedicated work station using MPR, MIP and VRT modes. The patient
received 80 cc of contrast.

[Series 6: best diast 76 % · axial · 0.39mm/px · z∈[-188,-148]mm · 2 of 301 slices shown]
[im 101/301  vessel]
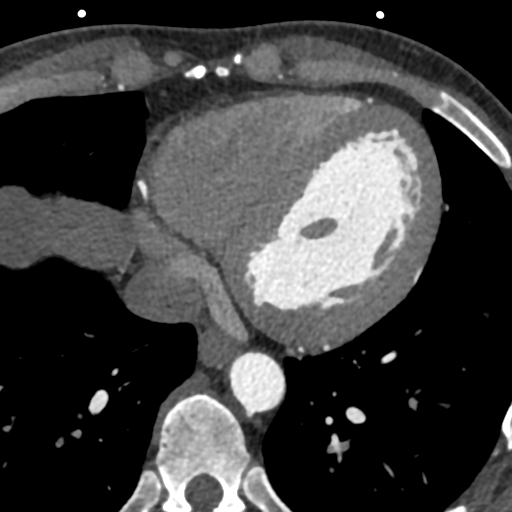
[im 201/301  vessel]
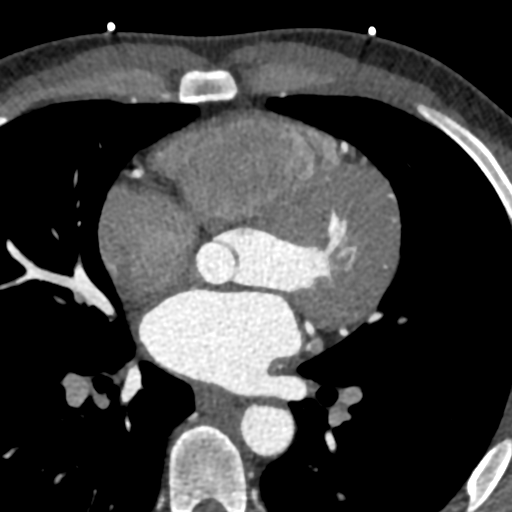

[Series 8: best syst · axial · 0.39mm/px · z∈[-188,-148]mm · 2 of 301 slices shown, 3 images]
[im 101/301  vessel]
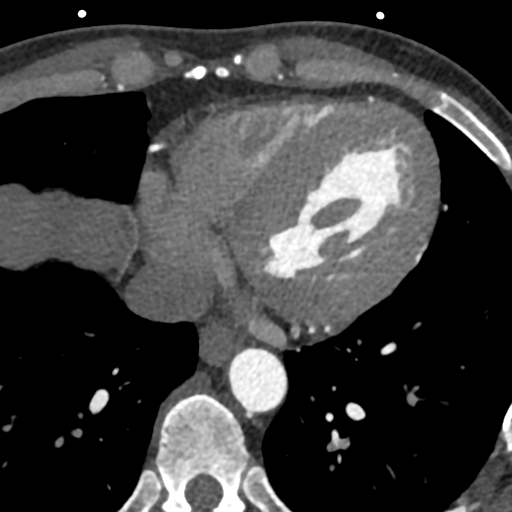
[im 101/301  lung]
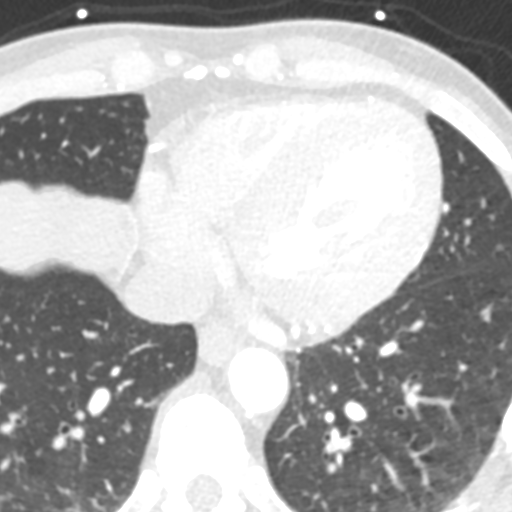
[im 201/301  vessel]
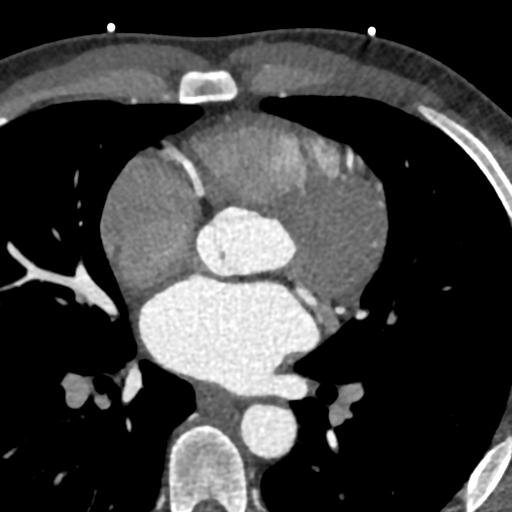

[Series 9: ts diast sharp · axial · 0.39mm/px · z∈[-188,-148]mm · 2 of 301 slices shown]
[im 101/301  lung]
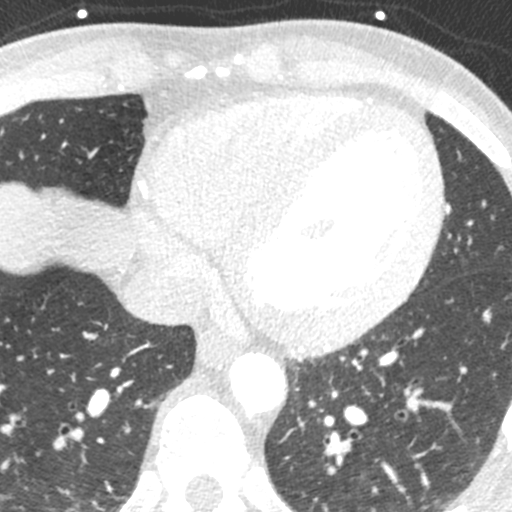
[im 201/301  lung]
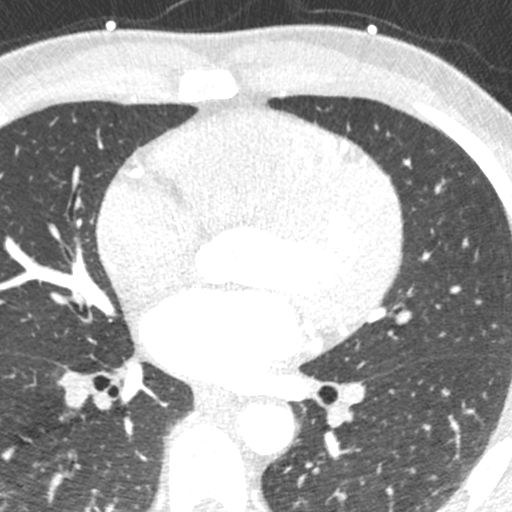

[Series 10: ts syst sharp · axial · 0.39mm/px · z∈[-188,-148]mm · 2 of 301 slices shown]
[im 101/301  lung]
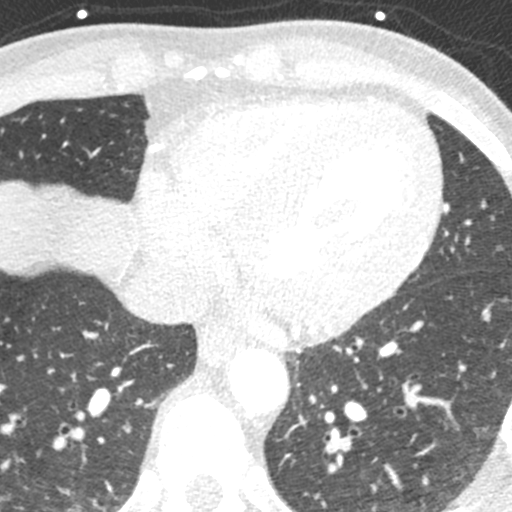
[im 201/301  lung]
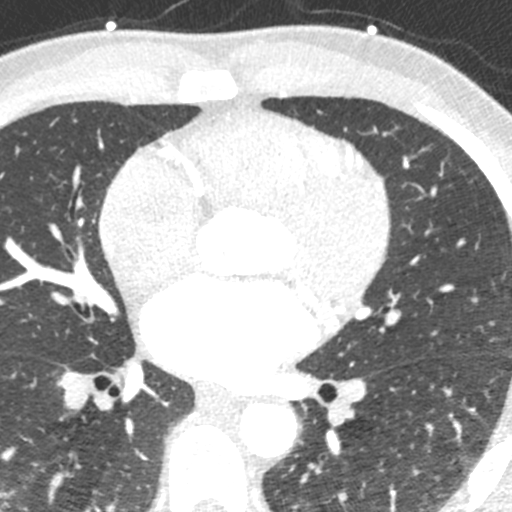

[8 of 20 positions shown; findings below may reference images not displayed]

FINDINGS: Vascular: Normal aortic caliber. Aortic atherosclerosis. No central
pulmonary embolism, on this non-dedicated study.

Mediastinum/Nodes: No imaged thoracic adenopathy.

Lungs/Pleura: No pleural fluid.  Clear imaged lungs.

Upper Abdomen: Hepatic dome 8 mm focus of hyperenhancement on 60/7
is most likely a perfusion anomaly, but incompletely imaged. Normal
imaged portions of the stomach.

Musculoskeletal: No acute osseous abnormality.
IMPRESSION: 1.  No acute findings in the imaged extracardiac chest.
2.  Aortic Atherosclerosis (Z2X9M-C9A.A).
FINDINGS: Image quality: excellent.

Noise artifact is: Limited.

Coronary Arteries:  Normal coronary origin.  Right dominance.

Left main: The left main is a large caliber vessel with a normal
take off from the left coronary cusp that bifurcates to form a left
anterior descending artery and a left circumflex artery. There is no
plaque or stenosis.

Left anterior descending artery: The proximal LAD is patent. The mid
LAD contains minimal non-calcified plaque (<25%). The distal LAD is
patent without plaque or stenosis. The LAD gives off 1 patent
diagonal branch.

Left circumflex artery: The LCX is non-dominant and patent with no
evidence of plaque or stenosis. The LCX gives off 3 patent obtuse
marginal branches.

Right coronary artery: The RCA is dominant with normal take off from
the right coronary cusp. There is no evidence of plaque or stenosis.
The RCA terminates as a PDA without evidence of plaque or stenosis.

Right Atrium: Right atrial size is within normal limits.

Right Ventricle: The right ventricular cavity is within normal
limits.

Left Atrium: Left atrial size is normal in size with no left atrial
appendage filling defect.

Left Ventricle: The ventricular cavity size is within normal limits.
There are no stigmata of prior infarction. There is no abnormal
filling defect.

Pulmonary arteries: Normal in size without proximal filling defect.

Pulmonary veins: Normal pulmonary venous drainage.

Pericardium: Normal thickness with no significant effusion or
calcium present.

Cardiac valves: The aortic valve is trileaflet without significant
calcification. The mitral valve is normal structure without
significant calcification.

Aorta: Normal caliber with no significant disease.

Extra-cardiac findings: See attached radiology report for
non-cardiac structures.
IMPRESSION: 1. Coronary calcium score of 0.

2. Normal coronary origin with right dominance.

3. Minimal (<25%) non-calcified, non-obstructive plaque in the mid
LAD.

RECOMMENDATIONS:
1. Minimal non-calcified plaque in the mid LAD. Consider
non-atherosclerotic causes of chest pain. Consider risk factor
modification.

*** End of Addendum ***
EXAM:
OVER-READ INTERPRETATION  CT CHEST

The following report is an over-read performed by radiologist Dr.
Meiltrine Arleta [REDACTED] on 11/17/2019. This over-read
does not include interpretation of cardiac or coronary anatomy or
pathology. The coronary CTA interpretation by the cardiologist is
attached.
FINDINGS: Vascular: Normal aortic caliber. Aortic atherosclerosis. No central
pulmonary embolism, on this non-dedicated study.

Mediastinum/Nodes: No imaged thoracic adenopathy.

Lungs/Pleura: No pleural fluid.  Clear imaged lungs.

Upper Abdomen: Hepatic dome 8 mm focus of hyperenhancement on 60/7
is most likely a perfusion anomaly, but incompletely imaged. Normal
imaged portions of the stomach.

Musculoskeletal: No acute osseous abnormality.
IMPRESSION: 1.  No acute findings in the imaged extracardiac chest.
2.  Aortic Atherosclerosis (Z2X9M-C9A.A).

## 2021-05-12 ENCOUNTER — Other Ambulatory Visit: Payer: Self-pay | Admitting: Adult Health

## 2021-05-12 DIAGNOSIS — I10 Essential (primary) hypertension: Secondary | ICD-10-CM

## 2021-09-02 ENCOUNTER — Telehealth: Payer: Self-pay | Admitting: Adult Health

## 2021-09-02 NOTE — Telephone Encounter (Signed)
Patient called in to schedule an appointment due to having shoulder and left armpit pain

## 2021-09-05 ENCOUNTER — Ambulatory Visit (INDEPENDENT_AMBULATORY_CARE_PROVIDER_SITE_OTHER): Payer: BC Managed Care – PPO

## 2021-09-05 ENCOUNTER — Other Ambulatory Visit: Payer: Self-pay

## 2021-09-05 ENCOUNTER — Ambulatory Visit: Payer: BC Managed Care – PPO | Admitting: Adult Health

## 2021-09-05 ENCOUNTER — Encounter: Payer: Self-pay | Admitting: Adult Health

## 2021-09-05 VITALS — BP 122/86 | HR 80 | Temp 97.9°F | Ht 67.0 in | Wt 178.0 lb

## 2021-09-05 DIAGNOSIS — M5412 Radiculopathy, cervical region: Secondary | ICD-10-CM

## 2021-09-05 DIAGNOSIS — M4722 Other spondylosis with radiculopathy, cervical region: Secondary | ICD-10-CM | POA: Diagnosis not present

## 2021-09-05 DIAGNOSIS — M4802 Spinal stenosis, cervical region: Secondary | ICD-10-CM | POA: Diagnosis not present

## 2021-09-05 MED ORDER — METHYLPREDNISOLONE 4 MG PO TBPK: ORAL_TABLET | ORAL | 0 refills | Status: DC

## 2021-09-05 MED ORDER — CYCLOBENZAPRINE HCL 10 MG PO TABS: 10.0000 mg | ORAL_TABLET | Freq: Three times a day (TID) | ORAL | 0 refills | Status: DC | PRN

## 2021-09-05 NOTE — Progress Notes (Signed)
Subjective:    Patient ID: Marc Hancock, male    DOB: 11/06/1970, 51 y.o.   MRN: [id number]  HPI 51 year old male who  has a past medical history of Allergy, GERD (gastroesophageal reflux disease), and Hypertension.  He presents to the office today for bilateral shoulder pain with numbness and tingling that radiates down both arms. Numbness and tingling is constant in nature. He has had to leave work early a few times over the last  months do to the pain. Denies trauma or injury. He did notice that during the holiday break his symptoms improved when he was not at work.   Denies loss of grip strength.    Review of Systems See HPI   Past Medical History:  Diagnosis Date   Allergy    GERD (gastroesophageal reflux disease)    Hypertension     Social History   Socioeconomic History   Marital status: Married    Spouse name: Not on file   Number of children: Not on file   Years of education: Not on file   Highest education level: Not on file  Occupational History   Not on file  Tobacco Use   Smoking status: Never    Passive exposure: Yes   Smokeless tobacco: Never  Vaping Use   Vaping Use: Never used  Substance and Sexual Activity   Alcohol use: Yes    Comment: occasionally   Drug use: Yes    Types: Marijuana    Comment: marijuana 2 x daily   Sexual activity: Yes    Partners: Female  Other Topics Concern   Not on file  Social History Narrative   He works at [employer]    He is married    Has 6 children - Age 64 to 56    Social Determinants of Health   Financial Resource Strain: Not on file  Food Insecurity: Not on file  Transportation Needs: Not on file  Physical Activity: Not on file  Stress: Not on file  Social Connections: Not on file  Intimate Partner Violence: Not on file    Past Surgical History:  Procedure Laterality Date   HYDROCELE EXCISION Right 04/11/2013   Procedure: RIGHT HYDROCELECTOMY ADULT;  Surgeon: Hanley Ben, MD;   Location: [facility];  Service: Urology;  Laterality: Right;   INGUINAL HERNIA REPAIR Right 04/11/2013   Procedure: HERNIA REPAIR INGUINAL ADULT;  Surgeon: Adin Hector, MD;  Location: [facility];  Service: General;  Laterality: Right;   VASECTOMY Bilateral 04/11/2013   Procedure: BILATERAL VASECTOMY;  Surgeon: Hanley Ben, MD;  Location: [facility];  Service: Urology;  Laterality: Bilateral;    Family History  Problem Relation Age of Onset   Alcohol abuse Other    Arthritis Other    Heart disease Other    Stroke Other    Depression Other    Diabetes Other     No Known Allergies  Current Outpatient Medications on File Prior to Visit  Medication Sig Dispense Refill   amLODipine (NORVASC) 10 MG tablet Take 1 tablet (10 mg total) by mouth daily. 90 tablet 0   aspirin 81 MG EC tablet TAKE 1 TABLET BY MOUTH EVERY DAY IN THE EVENING 60 tablet 0   losartan (COZAAR) 100 MG tablet TAKE 1 TABLET BY MOUTH EVERY DAY 90 tablet 3   omeprazole (PRILOSEC) 20 MG capsule Take 20 mg by mouth daily.     pantoprazole (PROTONIX) 40 MG tablet Take 1 tablet (40 mg  total) by mouth daily. 90 tablet 3   pravastatin (PRAVACHOL) 20 MG tablet TAKE 1 TABLET BY MOUTH EVERY DAY 90 tablet 3   No current facility-administered medications on file prior to visit.    BP 122/86    Pulse 80    Temp 97.9 F (36.6 C) (Oral)    Ht 5\' 7"  (1.702 m)    Wt 178 lb (80.7 kg)    SpO2 96%    BMI 27.88 kg/m       Objective:   Physical Exam Vitals and nursing note reviewed.  Constitutional:      Appearance: Normal appearance.  Cardiovascular:     Rate and Rhythm: Normal rate and regular rhythm.     Pulses: Normal pulses.     Heart sounds: Normal heart sounds.  Pulmonary:     Effort: Pulmonary effort is normal.     Breath sounds: Normal breath sounds.  Musculoskeletal:        General: Normal range of motion.     Right shoulder: Normal. No tenderness or bony tenderness. Normal range of motion. Normal strength. Normal pulse.      Left shoulder: Normal. No tenderness or bony tenderness. Normal range of motion. Normal strength. Normal pulse.     Right upper arm: Normal.     Left upper arm: Normal.     Right hand: Normal strength.     Left hand: Normal strength.  Skin:    General: Skin is warm and dry.     Capillary Refill: Capillary refill takes less than 2 seconds.  Neurological:     General: No focal deficit present.     Mental Status: He is alert and oriented to person, place, and time.     Motor: No weakness.  Psychiatric:        Mood and Affect: Mood normal.        Behavior: Behavior normal.        Thought Content: Thought content normal.        Judgment: Judgment normal.      Assessment & Plan:  1. Cervical radiculopathy - Will check xray today and send in medrol dose pack and muscle relaxer. Advised flexeril may make him sleepy. Consider MRI and referral to orthopedics in the future  - DG Cervical Spine Complete; Future - methylPREDNISolone (MEDROL DOSEPAK) 4 MG TBPK tablet; Take as directed  Dispense: 21 tablet; Refill: 0 - cyclobenzaprine (FLEXERIL) 10 MG tablet; Take 1 tablet (10 mg total) by mouth 3 (three) times daily as needed for muscle spasms.  Dispense: 30 tablet; Refill: 0  Dorothyann Peng, NP

## 2021-09-21 ENCOUNTER — Other Ambulatory Visit: Payer: Self-pay | Admitting: Family Medicine

## 2021-09-21 DIAGNOSIS — I10 Essential (primary) hypertension: Secondary | ICD-10-CM

## 2021-10-03 ENCOUNTER — Encounter: Payer: BC Managed Care – PPO | Admitting: Adult Health

## 2021-12-11 ENCOUNTER — Telehealth: Payer: Self-pay

## 2021-12-11 NOTE — Telephone Encounter (Signed)
---  Caller states he is having on and off chest pain. ?Currently no chest pain. States he feels good when ?resting, but then his chest begins when he starts doing ?things. Also gets mild SOB and very fatigued when he ?starts doing things. ? ?12/10/2021 4:05:02 PM Go to ED Now [name], RN, [name] ? ?Comments ?User: [name], RN Date/Time Marc Hancock Time): 12/10/2021 3:59:17 PM ?History: HTN. Cholesterol. ? ?User: [name], RN Date/Time Marc Hancock Time): 12/10/2021 4:02:41 PM ?Update: on and off chest pain is not getting worse and it is not happening more frequently. ? ?User: [name], RN Date/Time Marc Hancock Time): 12/10/2021 4:05:31 PM ?Caller refused to go to ED now. States he will wait and is requesting to be referred to a cardiologist. ? ?Referrals ?GO TO FACILITY REFUSED ? ?12/11/21 1031: Pt states he feels fine at this time; he is resting & took the day off from work. Pt requests appt on Monday with any available provider. Appt scheduled with Dr [name] for 4/17. ? ? ?

## 2021-12-15 ENCOUNTER — Encounter: Payer: Self-pay | Admitting: Family Medicine

## 2021-12-15 ENCOUNTER — Telehealth: Payer: Self-pay

## 2021-12-15 ENCOUNTER — Ambulatory Visit: Payer: BC Managed Care – PPO | Admitting: Family Medicine

## 2021-12-15 VITALS — BP 150/97 | HR 70 | Temp 98.7°F | Wt 175.8 lb

## 2021-12-15 DIAGNOSIS — R0602 Shortness of breath: Secondary | ICD-10-CM

## 2021-12-15 DIAGNOSIS — R0789 Other chest pain: Secondary | ICD-10-CM

## 2021-12-15 DIAGNOSIS — E782 Mixed hyperlipidemia: Secondary | ICD-10-CM

## 2021-12-15 DIAGNOSIS — R0683 Snoring: Secondary | ICD-10-CM

## 2021-12-15 DIAGNOSIS — F419 Anxiety disorder, unspecified: Secondary | ICD-10-CM

## 2021-12-15 DIAGNOSIS — I10 Essential (primary) hypertension: Secondary | ICD-10-CM | POA: Diagnosis not present

## 2021-12-15 LAB — CBC WITH DIFFERENTIAL/PLATELET
Basophils Absolute: 0.1 10*3/uL (ref 0.0–0.1)
Basophils Relative: 0.8 % (ref 0.0–3.0)
Eosinophils Absolute: 0.1 10*3/uL (ref 0.0–0.7)
Eosinophils Relative: 1.2 % (ref 0.0–5.0)
HCT: 38.6 % — ABNORMAL LOW (ref 39.0–52.0)
Hemoglobin: 12.9 g/dL — ABNORMAL LOW (ref 13.0–17.0)
Lymphocytes Relative: 36.2 % (ref 12.0–46.0)
Lymphs Abs: 2.9 10*3/uL (ref 0.7–4.0)
MCHC: 33.4 g/dL (ref 30.0–36.0)
MCV: 92.4 fl (ref 78.0–100.0)
Monocytes Absolute: 0.5 10*3/uL (ref 0.1–1.0)
Monocytes Relative: 6.7 % (ref 3.0–12.0)
Neutro Abs: 4.5 10*3/uL (ref 1.4–7.7)
Neutrophils Relative %: 55.1 % (ref 43.0–77.0)
Platelets: 233 10*3/uL (ref 150.0–400.0)
RBC: 4.18 Mil/uL — ABNORMAL LOW (ref 4.22–5.81)
RDW: 13.3 % (ref 11.5–15.5)
WBC: 8.1 10*3/uL (ref 4.0–10.5)

## 2021-12-15 LAB — BASIC METABOLIC PANEL
BUN: 14 mg/dL (ref 6–23)
CO2: 29 mEq/L (ref 19–32)
Calcium: 9.8 mg/dL (ref 8.4–10.5)
Chloride: 104 mEq/L (ref 96–112)
Creatinine, Ser: 1.37 mg/dL (ref 0.40–1.50)
GFR: 59.28 mL/min — ABNORMAL LOW (ref >60.00)
Glucose, Bld: 95 mg/dL (ref 70–99)
Potassium: 4.2 mEq/L (ref 3.5–5.1)
Sodium: 141 mEq/L (ref 135–145)

## 2021-12-15 LAB — T4, FREE: Free T4: 0.75 ng/dL (ref 0.60–1.60)

## 2021-12-15 LAB — LIPID PANEL
Cholesterol: 185 mg/dL (ref 0–200)
HDL: 51.4 mg/dL (ref >39.00)
LDL Cholesterol: 110 mg/dL — ABNORMAL HIGH (ref 0–99)
NonHDL: 134.07
Total CHOL/HDL Ratio: 4
Triglycerides: 121 mg/dL (ref 0.0–149.0)
VLDL: 24.2 mg/dL (ref 0.0–40.0)

## 2021-12-15 LAB — TSH: TSH: 2.07 u[IU]/mL (ref 0.35–5.50)

## 2021-12-15 NOTE — Telephone Encounter (Signed)
A user error has taken place: encounter opened in error, closed for administrative reasons.

## 2021-12-15 NOTE — Progress Notes (Signed)
Subjective:  ? ? Patient ID: Marc Hancock, male    DOB: 11-25-1970, 51 y.o.   MRN: [id number] ? ?Chief Complaint  ?Patient presents with  ? Shortness of Breath  ?  Off and on for a while, with intermittent chest pain. Chest pain in the left upper chest near shoulder, when happens it is like a soreness pain. Has not taken anything, just deep breathing. Last happened on Wednesday.   ? ? ?HPI ?Patient is a 51 year old male with pmh sig for HTN, GERD, renal calculi, HLD and fatigue followed by Shirline Frees, NP  and seen today for ongoing concern.  Pt endorses intermittent left upper chest soreness x yrs.  Typically occurs with exertion such as climbing a ladder but can occur at any time.  Sensation occurred last Wednesday while getting dressed for work.  P we will t states at the time he felt exhausted and had a headache.  The sensation was a 5 out of 10 soreness in left upper chest lasted hours and did not radiate.  Missed work last week 2/2 feeling bad.  Typically takes deep breaths when the sensation occurs.  Notes typical life stress.  Endorses chronic MJ use.  Told he snores at night by his wife.  Denies waking himself up gasping.  In the past sleep study ordered but not completed.  States his diet is terrible, eating mostly fried foods.  Does not check BP at home.  Taking Norvasc 10 mg and losartan 100 mg daily. ? ?Past Medical History:  ?Diagnosis Date  ? Allergy   ? GERD (gastroesophageal reflux disease)   ? Hypertension   ? ? ?No Known Allergies ? ?ROS ?General: Denies fever, chills, night sweats, changes in weight, changes in appetite + fatigue ?HEENT: Denies ear pain, changes in vision, rhinorrhea, sore throat + headache ?CV: Denies palpitations, orthopnea + SOB, CP ?Pulm: Denies cough, wheezing ?GI: Denies abdominal pain, nausea, vomiting, diarrhea, constipation ?GU: Denies dysuria, hematuria, frequency ?Msk: Denies muscle cramps, joint pains ?Neuro: Denies weakness, numbness, tingling ?Skin: Denies rashes,  bruising ?Psych: Denies depression, hallucinations +anxiety ?   ?Objective:  ?  ?Blood pressure (!) 150/97, pulse 70, temperature 98.7 ?F (37.1 ?C), temperature source Oral, weight 175 lb 12.8 oz (79.7 kg), SpO2 100 %. ? ?Gen. Pleasant, well-nourished, in no distress, normal affect   ?HEENT: [place]/AT, face symmetric, conjunctiva clear, no scleral icterus, PERRLA, EOMI, nares patent without drainage ?Neck: No JVD, no thyromegaly, no carotid bruits ?Lungs: no accessory muscle use, CTAB, no wheezes or rales ?Cardiovascular: RRR, skipped beats noted, no m/r/g, no peripheral edema ?Musculoskeletal: No TTP of chest.  No deformities, no cyanosis or clubbing, normal tone ?Neuro:  A&Ox3, CN II-XII intact, normal gait ?Skin:  Warm, no lesions/ rash ? ? ?Wt Readings from Last 3 Encounters:  ?12/15/21 175 lb 12.8 oz (79.7 kg)  ?09/05/21 178 lb (80.7 kg)  ?02/25/21 180 lb 4 oz (81.8 kg)  ? ? ?Lab Results  ?Component Value Date  ? WBC 7.3 02/29/2020  ? HGB 12.2 (L) 02/29/2020  ? HCT 36.4 (L) 02/29/2020  ? PLT 209.0 02/29/2020  ? GLUCOSE 93 02/29/2020  ? CHOL 151 02/29/2020  ? TRIG 72.0 02/29/2020  ? HDL 53.60 02/29/2020  ? LDLCALC 83 02/29/2020  ? ALT 75 (H) 02/29/2020  ? AST 54 (H) 02/29/2020  ? NA 140 02/29/2020  ? K 4.2 02/29/2020  ? CL 104 02/29/2020  ? CREATININE 1.11 02/29/2020  ? BUN 12 02/29/2020  ? CO2 31 02/29/2020  ?  TSH 1.13 02/29/2020  ? PSA 1.23 02/29/2020  ? HGBA1C 5.6 02/29/2020  ? ? ?  12/15/2021  ?  8:53 AM  ?GAD 7 : Generalized Anxiety Score  ?Nervous, Anxious, on Edge 3  ?Control/stop worrying 2  ?Worry too much - different things 3  ?Trouble relaxing 2  ?Restless 0  ?Easily annoyed or irritable 1  ?Afraid - awful might happen 2  ?Total GAD 7 Score 13  ?Anxiety Difficulty Not difficult at all  ? ? ?Assessment/Plan: ? ?Other chest pain ?-Intermittent ,chronic issue ?-less likely MSK.  Anxiety likely contributing. ?-seen by Cardiology in 2021 per chart review ?-EKG this visit was sinus bradycardia.  EKGs from  10/02/2019 and 09/22/2016 reviewed for comparison. ?-We will obtain labs. ?-Continue statin.  Cholesterol previously controlled. ?-Discussed the importance of lifestyle modifications. ?-Further recommendations based on labs. ?-We will have patient follow-up in the next few weeks with PCP ?-Plan: BMP, lipid panel, TSH, free T4, CBC ? ?Essential hypertension ?-Elevated ?-Continue losartan 100 mg daily and Norvasc 10 mg daily ?-Discussed the importance of lifestyle modifications ?-Patient encouraged to check BP at home and keep a log to bring with him to clinic ?-Plan: BMP, pt, T4 ? ?SOB (shortness of breath) ?-Discussed possible causes including a component of anxiety.   ?- normal ?-GAD-7 score 13 ?-Daily MJ use, smoking cessation encouraged. ?-Continue deep breathing and mindfulness. ?-Plan: CBC ? ?Mixed hyperlipidemia ?-Previously controlled on pravastatin 20 mg daily ?-Lifestyle modifications ?-Plan: Lipid panel ? ?Snoring ?-Possibly contributing to fatigue ?-Per chart review sleep study planned with pulm in 2021 however does not appear to have been completed. ?-Discuss sleep study with PCP ?-Plan: CBC ? ?Anxiety ?-New problem ?-GAD-7 score 13 this visit ?-Discussed may be contributing to some CP and SOB. ?-Patient encouraged to consider counseling.  Declines at this time. ?-Given handout ?-Follow-up PCP ?-Plan: TSH, free T4, CBC ? ?F/u in 2-4 weeks with PCP ? ?Abbe Amsterdam, MD ?

## 2021-12-21 ENCOUNTER — Other Ambulatory Visit: Payer: Self-pay | Admitting: Family Medicine

## 2021-12-21 DIAGNOSIS — I10 Essential (primary) hypertension: Secondary | ICD-10-CM

## 2022-03-20 ENCOUNTER — Other Ambulatory Visit: Payer: Self-pay | Admitting: *Deleted

## 2022-03-20 DIAGNOSIS — I10 Essential (primary) hypertension: Secondary | ICD-10-CM

## 2022-03-20 MED ORDER — LOSARTAN POTASSIUM 100 MG PO TABS: 100.0000 mg | ORAL_TABLET | Freq: Every day | ORAL | 0 refills | Status: DC

## 2022-06-20 ENCOUNTER — Other Ambulatory Visit: Payer: Self-pay | Admitting: Adult Health

## 2022-06-20 DIAGNOSIS — I10 Essential (primary) hypertension: Secondary | ICD-10-CM

## 2022-06-22 ENCOUNTER — Other Ambulatory Visit: Payer: Self-pay | Admitting: *Deleted

## 2022-06-22 DIAGNOSIS — I10 Essential (primary) hypertension: Secondary | ICD-10-CM

## 2022-06-23 MED ORDER — AMLODIPINE BESYLATE 5 MG PO TABS: 10.0000 mg | ORAL_TABLET | Freq: Every day | ORAL | 0 refills | Status: DC

## 2022-09-03 ENCOUNTER — Ambulatory Visit (INDEPENDENT_AMBULATORY_CARE_PROVIDER_SITE_OTHER): Payer: BC Managed Care – PPO

## 2022-09-03 ENCOUNTER — Ambulatory Visit: Payer: BC Managed Care – PPO | Admitting: Adult Health

## 2022-09-03 ENCOUNTER — Encounter: Payer: Self-pay | Admitting: Adult Health

## 2022-09-03 VITALS — BP 142/86 | HR 88 | Temp 98.0°F | Ht 67.0 in | Wt 177.0 lb

## 2022-09-03 DIAGNOSIS — M79675 Pain in left toe(s): Secondary | ICD-10-CM | POA: Diagnosis not present

## 2022-09-03 DIAGNOSIS — M19072 Primary osteoarthritis, left ankle and foot: Secondary | ICD-10-CM | POA: Diagnosis not present

## 2022-09-03 DIAGNOSIS — R079 Chest pain, unspecified: Secondary | ICD-10-CM

## 2022-09-03 DIAGNOSIS — R5383 Other fatigue: Secondary | ICD-10-CM | POA: Diagnosis not present

## 2022-09-03 NOTE — Progress Notes (Signed)
Subjective:    Patient ID: Matin Mattioli, male    DOB: Jun 15, 1971, 52 y.o.   MRN: [id number]  Chest Pain    52 year old male who  has a past medical history of Allergy, GERD (gastroesophageal reflux disease), and Hypertension.  He presents to the office today for chest pain and fatigue.   He first noticed that he has less energy overall, especially when it comes to working outside and doing physical work at his job. He does feel tired when resting. This has been going on for "awhile"  About two weeks ago or a little longer he started developed left sided chest pain that happens daily. Pain is worse with exertion but is felt at rest. Pain is felt as "sore", questionable palpitations. When the chest pain becomes bad he has to sit down and " relax " and the pain dissipates but does not disappear completely.   No pain up his jaw or down his arm.  Furthermore, he has been experiencing left great toe pain for " awhile". Pain with walking. Denies redness, warmth, or swelling   Review of Systems  Cardiovascular:  Positive for chest pain.   See HPI   Past Medical History:  Diagnosis Date   Allergy    GERD (gastroesophageal reflux disease)    Hypertension     Social History   Socioeconomic History   Marital status: Married    Spouse name: Not on file   Number of children: Not on file   Years of education: Not on file   Highest education level: Not on file  Occupational History   Not on file  Tobacco Use   Smoking status: Never    Passive exposure: Yes   Smokeless tobacco: Never  Vaping Use   Vaping Use: Never used  Substance and Sexual Activity   Alcohol use: Yes    Comment: occasionally   Drug use: Yes    Types: Marijuana    Comment: marijuana 2 x daily   Sexual activity: Yes    Partners: Female  Other Topics Concern   Not on file  Social History Narrative   He works at [employer]    He is married    Has 6 children - Age 67 to 42    Social Determinants  of Health   Financial Resource Strain: Not on file  Food Insecurity: Not on file  Transportation Needs: Not on file  Physical Activity: Not on file  Stress: Not on file  Social Connections: Not on file  Intimate Partner Violence: Not on file    Past Surgical History:  Procedure Laterality Date   HYDROCELE EXCISION Right 04/11/2013   Procedure: RIGHT HYDROCELECTOMY ADULT;  Surgeon: Hanley Ben, MD;  Location: [facility];  Service: Urology;  Laterality: Right;   INGUINAL HERNIA REPAIR Right 04/11/2013   Procedure: HERNIA REPAIR INGUINAL ADULT;  Surgeon: Adin Hector, MD;  Location: [facility];  Service: General;  Laterality: Right;   VASECTOMY Bilateral 04/11/2013   Procedure: BILATERAL VASECTOMY;  Surgeon: Hanley Ben, MD;  Location: [facility];  Service: Urology;  Laterality: Bilateral;    Family History  Problem Relation Age of Onset   Alcohol abuse Other    Arthritis Other    Heart disease Other    Stroke Other    Depression Other    Diabetes Other     No Known Allergies  Current Outpatient Medications on File Prior to Visit  Medication Sig Dispense Refill   amLODipine (  NORVASC) 5 MG tablet Take 2 tablets (10 mg total) by mouth daily. 60 tablet 0   aspirin 81 MG EC tablet TAKE 1 TABLET BY MOUTH EVERY DAY IN THE EVENING 60 tablet 0   cyclobenzaprine (FLEXERIL) 10 MG tablet Take 1 tablet (10 mg total) by mouth 3 (three) times daily as needed for muscle spasms. 30 tablet 0   losartan (COZAAR) 100 MG tablet TAKE 1 TABLET BY MOUTH EVERY DAY 90 tablet 0   methylPREDNISolone (MEDROL DOSEPAK) 4 MG TBPK tablet Take as directed 21 tablet 0   omeprazole (PRILOSEC) 20 MG capsule Take 20 mg by mouth daily.     pantoprazole (PROTONIX) 40 MG tablet Take 1 tablet (40 mg total) by mouth daily. 90 tablet 3   pravastatin (PRAVACHOL) 20 MG tablet TAKE 1 TABLET BY MOUTH EVERY DAY 90 tablet 3   No current facility-administered medications on file prior to visit.    There were no vitals taken  for this visit.      Objective:   Physical Exam Vitals and nursing note reviewed.  Constitutional:      Appearance: Normal appearance.  Cardiovascular:     Rate and Rhythm: Normal rate and regular rhythm.     Pulses: Normal pulses.     Heart sounds: Normal heart sounds.  Pulmonary:     Effort: Pulmonary effort is normal.     Breath sounds: Normal breath sounds.  Chest:     Chest wall: Tenderness present.    Musculoskeletal:        General: Tenderness present. Normal range of motion.     Right foot: Normal range of motion. Bony tenderness present.  Skin:    General: Skin is warm and dry.     Capillary Refill: Capillary refill takes less than 2 seconds.  Neurological:     General: No focal deficit present.     Mental Status: He is alert and oriented to person, place, and time.  Psychiatric:        Mood and Affect: Mood normal.        Behavior: Behavior normal.        Thought Content: Thought content normal.       Assessment & Plan:  1. Chest pain, unspecified type  - EKG 12-Lead- NSR, Rate 77.  - Slightly muscular tenderness under left breast but does not seem to be the same pain necessarily. Will do chest xray and labs, referral to cardiology  - Comprehensive metabolic panel - CBC with Differential/Platelet; Future - IBC + Ferritin; Future - DG Chest 2 View; Future - Ambulatory referral to Cardiology  2. Great toe pain, left  - DG Foot Complete Left; Future  3. Other fatigue  - Comprehensive metabolic panel - CBC with Differential/Platelet; Future - Vitamin B12; Future - VITAMIN D 25 Hydroxy (Vit-D Deficiency, Fractures); Future - IBC + Ferritin; Future  Dorothyann Peng, NP

## 2022-09-04 ENCOUNTER — Other Ambulatory Visit: Payer: Self-pay | Admitting: Adult Health

## 2022-09-04 ENCOUNTER — Telehealth: Payer: Self-pay | Admitting: Adult Health

## 2022-09-04 LAB — COMPREHENSIVE METABOLIC PANEL
ALT: 41 U/L (ref 0–53)
AST: 29 U/L (ref 0–37)
Albumin: 4.3 g/dL (ref 3.5–5.2)
Alkaline Phosphatase: 95 U/L (ref 39–117)
BUN: 22 mg/dL (ref 6–23)
CO2: 31 mEq/L (ref 19–32)
Calcium: 9.9 mg/dL (ref 8.4–10.5)
Chloride: 101 mEq/L (ref 96–112)
Creatinine, Ser: 1.53 mg/dL — ABNORMAL HIGH (ref 0.40–1.50)
GFR: 51.66 mL/min — ABNORMAL LOW (ref >60.00)
Glucose, Bld: 70 mg/dL (ref 70–99)
Potassium: 4.1 mEq/L (ref 3.5–5.1)
Sodium: 139 mEq/L (ref 135–145)
Total Bilirubin: 0.7 mg/dL (ref 0.2–1.2)
Total Protein: 7.2 g/dL (ref 6.0–8.3)

## 2022-09-04 LAB — CBC WITH DIFFERENTIAL/PLATELET
Basophils Absolute: 0.1 10*3/uL (ref 0.0–0.1)
Basophils Relative: 0.9 % (ref 0.0–3.0)
Eosinophils Absolute: 0.1 10*3/uL (ref 0.0–0.7)
Eosinophils Relative: 1.5 % (ref 0.0–5.0)
HCT: 42.5 % (ref 39.0–52.0)
Hemoglobin: 14.3 g/dL (ref 13.0–17.0)
Lymphocytes Relative: 44 % (ref 12.0–46.0)
Lymphs Abs: 4.1 10*3/uL — ABNORMAL HIGH (ref 0.7–4.0)
MCHC: 33.6 g/dL (ref 30.0–36.0)
MCV: 91.4 fl (ref 78.0–100.0)
Monocytes Absolute: 0.7 10*3/uL (ref 0.1–1.0)
Monocytes Relative: 7.2 % (ref 3.0–12.0)
Neutro Abs: 4.3 10*3/uL (ref 1.4–7.7)
Neutrophils Relative %: 46.4 % (ref 43.0–77.0)
Platelets: 225 10*3/uL (ref 150.0–400.0)
RBC: 4.65 Mil/uL (ref 4.22–5.81)
RDW: 13.7 % (ref 11.5–15.5)
WBC: 9.3 10*3/uL (ref 4.0–10.5)

## 2022-09-04 LAB — IBC + FERRITIN
Ferritin: 32.1 ng/mL (ref 22.0–322.0)
Iron: 132 ug/dL (ref 42–165)
Saturation Ratios: 29.4 % (ref 20.0–50.0)
TIBC: 449.4 ug/dL (ref 250.0–450.0)
Transferrin: 321 mg/dL (ref 212.0–360.0)

## 2022-09-04 LAB — VITAMIN D 25 HYDROXY (VIT D DEFICIENCY, FRACTURES): VITD: 11.23 ng/mL — ABNORMAL LOW (ref 30.00–100.00)

## 2022-09-04 LAB — VITAMIN B12: Vitamin B-12: 109 pg/mL — ABNORMAL LOW (ref 211–911)

## 2022-09-04 MED ORDER — VITAMIN D (ERGOCALCIFEROL) 1.25 MG (50000 UNIT) PO CAPS: 50000.0000 [IU] | ORAL_CAPSULE | ORAL | 1 refills | Status: AC

## 2022-09-04 NOTE — Telephone Encounter (Signed)
Spoke to pt and advise of Vit D and B12. Pt advised again that we will reach back out when Tommi Rumps is in. Pt verbalized understanding.

## 2022-09-04 NOTE — Telephone Encounter (Signed)
Attempted to call patient about his labs and xray. Unable to leave VM as " mail box is full"  Vitamin D 50,000 units weekly sent in

## 2022-09-04 NOTE — Telephone Encounter (Signed)
Pt is aware NP is gone for today

## 2022-09-08 ENCOUNTER — Other Ambulatory Visit: Payer: Self-pay

## 2022-09-08 DIAGNOSIS — M19072 Primary osteoarthritis, left ankle and foot: Secondary | ICD-10-CM

## 2022-09-08 NOTE — Telephone Encounter (Signed)
Pt called, requesting to speak with CMA. CMA was unavailable. Pt asked that CMA call back at their earliest convenience.

## 2022-09-08 NOTE — Telephone Encounter (Signed)
Unable to leave message to return phone call.

## 2022-09-08 NOTE — Telephone Encounter (Addendum)
Tried to call pt again no answer and unabe to lm   

## 2022-09-08 NOTE — Telephone Encounter (Signed)
Tried to call pt again no answer and unabe to lm

## 2022-09-11 ENCOUNTER — Ambulatory Visit: Payer: BC Managed Care – PPO | Attending: Nurse Practitioner | Admitting: Nurse Practitioner

## 2022-09-11 ENCOUNTER — Encounter: Payer: Self-pay | Admitting: Nurse Practitioner

## 2022-09-11 VITALS — BP 128/86 | HR 90 | Ht 67.0 in | Wt 176.0 lb

## 2022-09-11 DIAGNOSIS — I10 Essential (primary) hypertension: Secondary | ICD-10-CM

## 2022-09-11 DIAGNOSIS — K219 Gastro-esophageal reflux disease without esophagitis: Secondary | ICD-10-CM | POA: Diagnosis not present

## 2022-09-11 DIAGNOSIS — R0602 Shortness of breath: Secondary | ICD-10-CM | POA: Diagnosis not present

## 2022-09-11 DIAGNOSIS — R079 Chest pain, unspecified: Secondary | ICD-10-CM

## 2022-09-11 NOTE — Patient Instructions (Addendum)
Medication Instructions:  Your physician recommends that you continue on your current medications as directed. Please refer to the Current Medication list given to you today.   *If you need a refill on your cardiac medications before your next appointment, please call your pharmacy*   Lab Work: NONE ordered at this time of appointment   If you have labs (blood work) drawn today and your tests are completely normal, you will receive your results only by: [place] (if you have MyChart) OR A paper copy in the mail If you have any lab test that is abnormal or we need to change your treatment, we will call you to review the results.   Testing/Procedures: Your physician has requested that you have an echocardiogram. Echocardiography is a painless test that uses sound waves to create images of your heart. It provides your doctor with information about the size and shape of your heart and how well your heart's chambers and valves are working. This procedure takes approximately one hour. There are no restrictions for this procedure. Please do NOT wear cologne, perfume, aftershave, or lotions (deodorant is allowed). Please arrive 15 minutes prior to your appointment time.    Follow-Up: At [facility], you and your health needs are our priority.  As part of our continuing mission to provide you with exceptional heart care, we have created designated Provider Care Teams.  These Care Teams include your primary Cardiologist (physician) and Advanced Practice Providers (APPs -  Physician Assistants and Nurse Practitioners) who all work together to provide you with the care you need, when you need it.  We recommend signing up for the patient portal called "MyChart".  Sign up information is provided on this After Visit Summary.  MyChart is used to connect with patients for Virtual Visits (Telemedicine).  Patients are able to view lab/test results, encounter notes, upcoming appointments, etc.   Non-urgent messages can be sent to your provider as well.   To learn more about what you can do with MyChart, go to [email or web address].    Your next appointment:   3-5 month(s)  Provider:   Donato Heinz, MD     Other Instructions Referral sent to pulmonology

## 2022-09-11 NOTE — Progress Notes (Signed)
Office Visit    Patient Name: Marc Hancock Date of Encounter: 09/11/2022  Primary Care Provider:  Dorothyann Peng, NP Primary Cardiologist:  Donato Heinz, MD  Chief Complaint    52 year old male with a history of chest pain, hypertension, and GERD who presents for follow-up related to chest pain.  Past Medical History    Past Medical History:  Diagnosis Date   Allergy    GERD (gastroesophageal reflux disease)    Hypertension    Past Surgical History:  Procedure Laterality Date   HYDROCELE EXCISION Right 04/11/2013   Procedure: RIGHT HYDROCELECTOMY ADULT;  Surgeon: Hanley Ben, MD;  Location: [facility];  Service: Urology;  Laterality: Right;   INGUINAL HERNIA REPAIR Right 04/11/2013   Procedure: HERNIA REPAIR INGUINAL ADULT;  Surgeon: Adin Hector, MD;  Location: [facility];  Service: General;  Laterality: Right;   VASECTOMY Bilateral 04/11/2013   Procedure: BILATERAL VASECTOMY;  Surgeon: Hanley Ben, MD;  Location: [facility];  Service: Urology;  Laterality: Bilateral;    Allergies  No Known Allergies   Labs/Other Studies Reviewed    The following studies were reviewed today: Coronary CTA 10/2019: IMPRESSION: 1. Coronary calcium score of 0.   2. Normal coronary origin with right dominance.   3. Minimal (<25%) non-calcified, non-obstructive plaque in the mid LAD.   RECOMMENDATIONS: 1. Minimal non-calcified plaque in the mid LAD. Consider non-atherosclerotic causes of chest pain. Consider risk factor modification.  Recent Labs: 12/15/2021: TSH 2.07 09/03/2022: ALT 41; BUN 22; Creatinine, Ser 1.53; Hemoglobin 14.3; Platelets 225.0; Potassium 4.1; Sodium 139  Recent Lipid Panel    Component Value Date/Time   CHOL 185 12/15/2021 0920   TRIG 121.0 12/15/2021 0920   HDL 51.40 12/15/2021 0920   CHOLHDL 4 12/15/2021 0920   VLDL 24.2 12/15/2021 0920   LDLCALC 110 (H) 12/15/2021 0920    History of Present Illness    52 year old male with the above past  medical history including chest pain, hypertension, and GERD.  He was referred to Dr. Gardiner Rhyme in 2021 in the setting of nonexertional chest tightness.  He was last seen in the office on 10/02/2019.  His symptoms were atypical overall.  Coronary CTA showed coronary calcium score of 0, normal noncalcified plaque in the mid LAD.  He saw his PCP 12/19/2022 and reported chest pain, generalized fatigue.  Follow-up with cardiology was recommended.  Of note labs per PCP revealed low vitamin D and B12.  He presents today for follow-up.  Since his last visit he has been stable overall from a cardiac standpoint.  Over the past couple of months he has noticed some atypical chest discomfort that occurs both at rest and with activity, mostly when he is working and feeling stressed.  He describes a discomfort in his left shoulder.  He sits down and takes deep breaths and his symptoms resolve.  He notes he had similar symptoms at the time of his coronary CTA in 2021.  He also notes stable chronic dyspnea both at rest and with activity.  He denies tobacco use but does note that he smokes marijuana regularly.  He is feeling well today and notes that over the past several days he has not had any chest discomfort.  He is working on gradually increasing his activity through exercise.  Home Medications    Current Outpatient Medications  Medication Sig Dispense Refill   amLODipine (NORVASC) 5 MG tablet Take 2 tablets (10 mg total) by mouth daily. 60 tablet 0   aspirin  81 MG EC tablet TAKE 1 TABLET BY MOUTH EVERY DAY IN THE EVENING 60 tablet 0   losartan (COZAAR) 100 MG tablet TAKE 1 TABLET BY MOUTH EVERY DAY 90 tablet 0   omeprazole (PRILOSEC) 20 MG capsule Take 20 mg by mouth daily.     pravastatin (PRAVACHOL) 20 MG tablet TAKE 1 TABLET BY MOUTH EVERY DAY 90 tablet 3   Vitamin D, Ergocalciferol, (DRISDOL) 1.25 MG (50000 UNIT) CAPS capsule Take 1 capsule (50,000 Units total) by mouth every 7 (seven) days. 12 capsule 1   No  current facility-administered medications for this visit.     Review of Systems    He denies palpitations, pnd, orthopnea, n, v, dizziness, syncope, edema, weight gain, or early satiety. All other systems reviewed and are otherwise negative except as noted above.   Physical Exam    VS:  BP 128/86   Pulse 90   Ht 5\' 7"  (1.702 m)   Wt 176 lb (79.8 kg)   SpO2 97%   BMI 27.57 kg/m   GEN: Well nourished, well developed, in no acute distress. HEENT: normal. Neck: Supple, no JVD, carotid bruits, or masses. Cardiac: RRR, no murmurs, rubs, or gallops. No clubbing, cyanosis, edema.  Radials/DP/PT 2+ and equal bilaterally.  Respiratory:  Respirations regular and unlabored, clear to auscultation bilaterally. GI: Soft, nontender, nondistended, BS + x 4. MS: no deformity or atrophy. Skin: warm and dry, no rash. Neuro:  Strength and sensation are intact. Psych: Normal affect.  Accessory Clinical Findings    ECG personally reviewed by me today -NSR, 90 bpm, nonspecific ST/T wave abnormality- no acute changes.   Lab Results  Component Value Date   WBC 9.3 09/03/2022   HGB 14.3 09/03/2022   HCT 42.5 09/03/2022   MCV 91.4 09/03/2022   PLT 225.0 09/03/2022   Lab Results  Component Value Date   CREATININE 1.53 (H) 09/03/2022   BUN 22 09/03/2022   NA 139 09/03/2022   K 4.1 09/03/2022   CL 101 09/03/2022   CO2 31 09/03/2022   Lab Results  Component Value Date   ALT 41 09/03/2022   AST 29 09/03/2022   ALKPHOS 95 09/03/2022   BILITOT 0.7 09/03/2022   Lab Results  Component Value Date   CHOL 185 12/15/2021   HDL 51.40 12/15/2021   LDLCALC 110 (H) 12/15/2021   TRIG 121.0 12/15/2021   CHOLHDL 4 12/15/2021    Lab Results  Component Value Date   HGBA1C 5.6 02/29/2020    Assessment & Plan    1. Chest pain: Coronary CTA showed coronary calcium score of 0, normal noncalcified plaque in the mid LAD.  He notes a 2 month history of atypical chest discomfort that occurs both at rest  and with activity, only when patient is working and stressed. He describes it as a discomfort in his left shoulder. He sits down and takes deep breaths and his symptoms resolve.  Similar to his symptoms that prompted his coronary CTA.  He denies any symptoms over the past several days.  He does have stable chronic dyspnea. Will repeat echo.  Prior CT reassuring.  Discussed possible ischemic evaluation with ETT versus cardiac PET stress test.  Patient declines at this time.  I encouraged him to continue to increase his activity as tolerated and notify 05/01/2020 if he has worsening exertional symptoms. Discussed ED precautions.  Also discussed stress/anxiety management there appears to be a correlation between his symptoms and stress.  Continue aspirin, amlodipine, losartan, pravastatin.  2. Shortness of breath: Chronic problem since 2020. Will obtain echo. He had an initial consult with pulmonology in 2020. Sleep study and PFTs were recommended, but pt never followed up. Will refer back to pulmonology.   3. Hypertension: BP well controlled. Continue current antihypertensive regimen.   4. GERD: Controlled with medication.   5. Marijuana use: Endorses regular marijuana use (smokes).  Full cessation advised.  6. Disposition: Follow-up in 3-5 months with Dr. Gardiner Rhyme.       Lenna Sciara, NP 09/11/2022, 9:48 AM

## 2022-09-12 ENCOUNTER — Other Ambulatory Visit: Payer: Self-pay | Admitting: Adult Health

## 2022-09-12 DIAGNOSIS — I10 Essential (primary) hypertension: Secondary | ICD-10-CM

## 2022-09-16 ENCOUNTER — Ambulatory Visit (HOSPITAL_BASED_OUTPATIENT_CLINIC_OR_DEPARTMENT_OTHER): Payer: BC Managed Care – PPO | Admitting: Orthopaedic Surgery

## 2022-09-30 ENCOUNTER — Ambulatory Visit (INDEPENDENT_AMBULATORY_CARE_PROVIDER_SITE_OTHER): Payer: BC Managed Care – PPO | Admitting: Orthopaedic Surgery

## 2022-09-30 DIAGNOSIS — M19079 Primary osteoarthritis, unspecified ankle and foot: Secondary | ICD-10-CM

## 2022-09-30 NOTE — Progress Notes (Signed)
Chief Complaint: Left great toe pain     History of Present Illness:    Marc Hancock is a 52 y.o. male presents today with multiple years of left great toe pain.  He has previously had orthotics and rigid insoles for this.  He does have a known history of first MTP osteoarthritis.  He works at [employer].  He states that he has bothered by most activities that require toe off.  He is here today for further discussion.  He has not previously had any injections.    Surgical History:   none  PMH/PSH/Family History/Social History/Meds/Allergies:    Past Medical History:  Diagnosis Date   Allergy    GERD (gastroesophageal reflux disease)    Hypertension    Past Surgical History:  Procedure Laterality Date   HYDROCELE EXCISION Right 04/11/2013   Procedure: RIGHT HYDROCELECTOMY ADULT;  Surgeon: Hanley Ben, MD;  Location: [facility];  Service: Urology;  Laterality: Right;   INGUINAL HERNIA REPAIR Right 04/11/2013   Procedure: HERNIA REPAIR INGUINAL ADULT;  Surgeon: Adin Hector, MD;  Location: [facility];  Service: General;  Laterality: Right;   VASECTOMY Bilateral 04/11/2013   Procedure: BILATERAL VASECTOMY;  Surgeon: Hanley Ben, MD;  Location: [facility];  Service: Urology;  Laterality: Bilateral;   Social History   Socioeconomic History   Marital status: Married    Spouse name: Not on file   Number of children: Not on file   Years of education: Not on file   Highest education level: Not on file  Occupational History   Not on file  Tobacco Use   Smoking status: Never    Passive exposure: Yes   Smokeless tobacco: Never  Vaping Use   Vaping Use: Never used  Substance and Sexual Activity   Alcohol use: Yes    Comment: occasionally   Drug use: Yes    Types: Marijuana    Comment: marijuana 2 x daily   Sexual activity: Yes    Partners: Female  Other Topics Concern   Not on file  Social History Narrative   He  works at [employer]    He is married    Has 6 children - Age 71 to 38    Social Determinants of Radio broadcast assistant Strain: Not on file  Food Insecurity: Not on file  Transportation Needs: Not on file  Physical Activity: Not on file  Stress: Not on file  Social Connections: Not on file   Family History  Problem Relation Age of Onset   Alcohol abuse Other    Arthritis Other    Heart disease Other    Stroke Other    Depression Other    Diabetes Other    No Known Allergies Current Outpatient Medications  Medication Sig Dispense Refill   amLODipine (NORVASC) 5 MG tablet TAKE 2 TABLETS BY MOUTH EVERY DAY 180 tablet 1   aspirin 81 MG EC tablet TAKE 1 TABLET BY MOUTH EVERY DAY IN THE EVENING 60 tablet 0   losartan (COZAAR) 100 MG tablet TAKE 1 TABLET BY MOUTH EVERY DAY 90 tablet 0   omeprazole (PRILOSEC) 20 MG capsule Take 20 mg by mouth daily.     pravastatin (PRAVACHOL) 20 MG tablet TAKE 1 TABLET BY MOUTH EVERY DAY 90 tablet  3   Vitamin D, Ergocalciferol, (DRISDOL) 1.25 MG (50000 UNIT) CAPS capsule Take 1 capsule (50,000 Units total) by mouth every 7 (seven) days. 12 capsule 1   No current facility-administered medications for this visit.   No results found.  Review of Systems:   A ROS was performed including pertinent positives and negatives as documented in the HPI.  Physical Exam :   Constitutional: NAD and appears stated age Neurological: Alert and oriented Psych: Appropriate affect and cooperative There were no vitals taken for this visit.   Comprehensive Musculoskeletal Exam:    Tenderness palpation about the left first MTP worse with any type of range of motion.  He has 10 degrees of painful arc.  Otherwise he has a plantigrade left foot with appropriate arch.  Remainder of distal neurosensory exam is intact  Imaging:   Xray (3 views left foot): Moderate to severe left first MTP osteoarthritis   I personally reviewed and interpreted the  radiographs.   Assessment:   52 y.o. male with significant left first MTP osteoarthritis.  I did discuss treatment options with him today.  I did discuss specifically that we could consider an injection versus a rigid insole to protect against toe off.  Overall he is hoping to avoid any treatments that would just be temporary in nature.  At this time he is looking for more definitive treatment as he does enjoy being active with his son who is playing basketball which he is quite limited doing.  Side effect I did discuss the role for possible first MTP fusion.  This time I will plan to refer him to my partner Dr. Sharol Given for assessment of this  Plan :    -Plan to refer him to Dr. Sharol Given for discussion of left first MTP arthritis     I personally saw and evaluated the patient, and participated in the management and treatment plan.  Vanetta Mulders, MD Attending Physician, Orthopedic Surgery  This document was dictated using Dragon voice recognition software. A reasonable attempt at proof reading has been made to minimize errors.

## 2022-10-05 ENCOUNTER — Encounter (HOSPITAL_COMMUNITY): Payer: Self-pay | Admitting: Nurse Practitioner

## 2022-10-05 ENCOUNTER — Encounter (HOSPITAL_COMMUNITY): Payer: Self-pay

## 2022-10-05 ENCOUNTER — Other Ambulatory Visit (HOSPITAL_COMMUNITY): Payer: BC Managed Care – PPO

## 2022-10-12 ENCOUNTER — Ambulatory Visit: Payer: BC Managed Care – PPO | Admitting: Orthopedic Surgery

## 2022-11-14 ENCOUNTER — Other Ambulatory Visit: Payer: Self-pay | Admitting: Adult Health

## 2022-11-14 DIAGNOSIS — I10 Essential (primary) hypertension: Secondary | ICD-10-CM

## 2022-12-13 NOTE — Progress Notes (Unsigned)
Cardiology Office Note:    Date:  12/14/2022   ID:  Marc Hancock, DOB Sep 26, 1970, MRN [id number]  PCP:  Shirline Frees, NP  Cardiologist:  Little Ishikawa, MD  Electrophysiologist:  None   Referring MD: Shirline Frees, NP   Chief Complaint  Patient presents with   Shortness of Breath    History of Present Illness:    Marc Hancock is a 52 y.o. male with a hx of GERD, hypertension who presents for follow-up.  He was referred by Shirline Frees, NP for an evaluation of chest pain, initially seen 10/02/2019.  Reports chest pain started 2 months prior..  States that it occurs across his whole chest.  Describes as tightness in chest, 6-7/10 in intensity.  Has been occurring about every 2 weeks or so.  Last couple of minutes and resolves spontaneously.  Has not noted what brings it on.  He does not think there is any relationship with exertion, but does think it occurs more frequently during times of stress.  No smoking history.  Denies any heart disease in his immediate family.  Reports that he has not been exercising since the pandemic started.  Prior to this was playing basketball at the [facility].  Reports BP has not been well controlled, recently increased amlodipine to 10 mg daily and BP has been much better controlled since that time.  Coronary CTA 11/17/2019 shows nonobstructive CAD (less than 25% stenosis in mid LAD), calcium score 0.  Since last clinic visit, he reports he is doing okay.  States that chest pain has improved.  Continues to have shortness of breath.  Also having lightheadedness but denies any syncope.  No recent palpitations.  Denies any lower extremity edema.  Past Medical History:  Diagnosis Date   Allergy    GERD (gastroesophageal reflux disease)    Hypertension     Past Surgical History:  Procedure Laterality Date   HYDROCELE EXCISION Right 04/11/2013   Procedure: RIGHT HYDROCELECTOMY ADULT;  Surgeon: Lindaann Slough, MD;  [facility];  Service: Urology;   Laterality: Right;   INGUINAL HERNIA REPAIR Right 04/11/2013   Procedure: HERNIA REPAIR INGUINAL ADULT;  Surgeon: Ernestene Mention, MD;  Location[facility];  Service: General;  Laterality: Right;   VASECTOMY Bilateral 04/11/2013   Procedure: BILATERAL VASECTOMY;  Surgeon: Lindaann Slough, MD;  Location[facility];  Service: Urology;  Laterality: Bilateral;    Current Medications: Current Meds  Medication Sig   amLODipine (NORVASC) 5 MG tablet TAKE 2 TABLETS BY MOUTH EVERY DAY   aspirin 81 MG EC tablet TAKE 1 TABLET BY MOUTH EVERY DAY IN THE EVENING   losartan (COZAAR) 100 MG tablet TAKE 1 TABLET BY MOUTH EVERY DAY   omeprazole (PRILOSEC) 20 MG capsule Take 20 mg by mouth daily.   pravastatin (PRAVACHOL) 20 MG tablet TAKE 1 TABLET BY MOUTH EVERY DAY     Allergies:   Patient has no known allergies.   Social History   Socioeconomic History   Marital status: Married    Spouse name: Not on file   Number of children: Not on file   Years of education: Not on file   Highest education level: Not on file  Occupational History   Not on file  Tobacco Use   Smoking status: Never    Passive exposure: Yes   Smokeless tobacco: Never  Vaping Use   Vaping Use: Never used  Substance and Sexual Activity   Alcohol use: Yes    Comment: occasionally  Drug use: Yes    Types: Marijuana    Comment: marijuana 2 x daily   Sexual activity: Yes    Partners: Female  Other Topics Concern   Not on file  Social History Narrative   He works at [employer]    He is married    Has 6 children - Age 3 to 6    Social Determinants of Corporate investment banker Strain: Not on file  Food Insecurity: Not on file  Transportation Needs: Not on file  Physical Activity: Not on file  Stress: Not on file  Social Connections: Not on file     Family History: The patient's family history includes Alcohol abuse in an other family member; Arthritis in an other family member; Depression in an other family  member; Diabetes in an other family member; Heart disease in an other family member; Stroke in an other family member.  ROS:   Please see the history of present illness.     All other systems reviewed and are negative.  EKGs/Labs/Other Studies Reviewed:    The following studies were reviewed today:   EKG:  EKG is ordered today.  The ekg ordered today demonstrates sinus rhythm, rate 85, no ST/T abnormalities  Recent Labs: 12/15/2021: TSH 2.07 09/03/2022: ALT 41; BUN 22; Creatinine, Ser 1.53; Hemoglobin 14.3; Platelets 225.0; Potassium 4.1; Sodium 139  Recent Lipid Panel    Component Value Date/Time   CHOL 185 12/15/2021 0920   TRIG 121.0 12/15/2021 0920   HDL 51.40 12/15/2021 0920   CHOLHDL 4 12/15/2021 0920   VLDL 24.2 12/15/2021 0920   LDLCALC 110 (H) 12/15/2021 0920    Physical Exam:    VS:  BP (!) 138/92   Pulse 68   Ht  (1.702 m)   Wt 178 lb (80.7 kg)   SpO2 96%   BMI 27.88 kg/m     Wt Readings from Last 3 Encounters:  12/14/22 178 lb (80.7 kg)  09/11/22 176 lb (79.8 kg)  09/03/22 177 lb (80.3 kg)     GEN:  Well nourished, well developed in no acute distress HEENT: Normal NECK: No JVD LYMPHATICS: No lymphadenopathy CARDIAC: RRR, no murmurs, rubs, gallops RESPIRATORY:  Clear to auscultation without rales, wheezing or rhonchi  ABDOMEN: Soft, non-tender, non-distended MUSCULOSKELETAL:  No edema; No deformity  SKIN: Warm and dry NEUROLOGIC:  Alert and oriented x 3 PSYCHIATRIC:  Normal affect   ASSESSMENT:    1. CAD in native artery   2. Essential hypertension   3. Hyperlipidemia, unspecified hyperlipidemia type   4. Shortness of breath     PLAN:    In order of problems listed above:  CAD: Reported atypical chest pain.  Coronary CTA 11/17/2019 shows nonobstructive CAD (less than 25% stenosis in mid LAD), calcium score 0. -Continue pravastatin, will check lipid panel  Hypertension: On amlodipine 10 mg daily and losartan 100 mg daily.  BP elevated  in clinic today but reports did not take his medications.  Asked to check BP twice daily for next week and call with results  Hyperlipidemia: On pravastatin 20 mg daily.  LDL 110 on 11/2021.  Goal LDL less than 70 given CAD as above. Check lipid panel and will likely switch to high intensity statin  Chronic shortness of breath: Check echocardiogram.  Seen by pulmonology in 2020.  Sleep study, PFTs recommended but he did not follow-up.  Recommend follow-up with pulmonology  RTC in 3 months  Medication Adjustments/Labs and Tests Ordered:  Current medicines are reviewed at length with the patient today.  Concerns regarding medicines are outlined above.  Orders Placed This Encounter  Procedures   Basic metabolic panel   Lipid panel   No orders of the defined types were placed in this encounter.   Patient Instructions  Medication Instructions:  Your physician recommends that you continue on your current medications as directed. Please refer to the Current Medication list given to you today.  *If you need a refill on your cardiac medications before your next appointment, please call your pharmacy*   Lab Work: BMET, Lipid today  If you have labs (blood work) drawn today and your tests are completely normal, you will receive your results only by: MyChart Message (if you have MyChart) OR A paper copy in the mail If you have any lab test that is abnormal or we need to change your treatment, we will call you to review the results.   Testing/Procedures: Your physician has requested that you have an echocardiogram. Echocardiography is a painless test that uses sound waves to create images of your heart. It provides your doctor with information about the size and shape of your heart and how well your heart's chambers and valves are working. This procedure takes approximately one hour. There are no restrictions for this procedure. Please do NOT wear cologne, perfume, aftershave, or lotions  (deodorant is allowed). Please arrive 15 minutes prior to your appointment time.  Follow-Up: At [facility], you and your health needs are our priority.  As part of our continuing mission to provide you with exceptional heart care, we have created designated Provider Care Teams.  These Care Teams include your primary Cardiologist (physician) and Advanced Practice Providers (APPs -  Physician Assistants and Nurse Practitioners) who all work together to provide you with the care you need, when you need it.  We recommend signing up for the patient portal called "MyChart".  Sign up information is provided on this After Visit Summary.  MyChart is used to connect with patients for Virtual Visits (Telemedicine).  Patients are able to view lab/test results, encounter notes, upcoming appointments, etc.  Non-urgent messages can be sent to your provider as well.   To learn more about what you can do with MyChart, go to [email or web address].    Your next appointment:   3 month(s)  Provider:   Little Ishikawa, MD     Other Instructions Call Pulmonology to schedule appointment- [phone]  Recommend Omron upper arm blood pressure cuff  Please check your blood pressure at home twice daily, write it down.  Call the office or send message via Mychart with the readings in 1 week for Dr. Bjorn Pippin to review.      Signed, Little Ishikawa, MD  12/14/2022 9:27 AM    [facility]

## 2022-12-14 ENCOUNTER — Encounter: Payer: Self-pay | Admitting: Cardiology

## 2022-12-14 ENCOUNTER — Ambulatory Visit: Payer: BC Managed Care – PPO | Attending: Cardiology | Admitting: Cardiology

## 2022-12-14 VITALS — BP 138/92 | HR 68 | Ht 67.0 in | Wt 178.0 lb

## 2022-12-14 DIAGNOSIS — I2510 Atherosclerotic heart disease of native coronary artery without angina pectoris: Secondary | ICD-10-CM | POA: Diagnosis not present

## 2022-12-14 DIAGNOSIS — R0602 Shortness of breath: Secondary | ICD-10-CM

## 2022-12-14 DIAGNOSIS — E785 Hyperlipidemia, unspecified: Secondary | ICD-10-CM

## 2022-12-14 DIAGNOSIS — I10 Essential (primary) hypertension: Secondary | ICD-10-CM

## 2022-12-14 NOTE — Addendum Note (Signed)
Addended by: Johney Frame A on: 12/14/2022 09:41 AM   Modules accepted: Orders

## 2022-12-14 NOTE — Patient Instructions (Signed)
Medication Instructions:  Your physician recommends that you continue on your current medications as directed. Please refer to the Current Medication list given to you today.  *If you need a refill on your cardiac medications before your next appointment, please call your pharmacy*   Lab Work: BMET, Lipid today  If you have labs (blood work) drawn today and your tests are completely normal, you will receive your results only by: MyChart Message (if you have MyChart) OR A paper copy in the mail If you have any lab test that is abnormal or we need to change your treatment, we will call you to review the results.   Testing/Procedures: Your physician has requested that you have an echocardiogram. Echocardiography is a painless test that uses sound waves to create images of your heart. It provides your doctor with information about the size and shape of your heart and how well your heart's chambers and valves are working. This procedure takes approximately one hour. There are no restrictions for this procedure. Please do NOT wear cologne, perfume, aftershave, or lotions (deodorant is allowed). Please arrive 15 minutes prior to your appointment time.  Follow-Up: At [facility], you and your health needs are our priority.  As part of our continuing mission to provide you with exceptional heart care, we have created designated Provider Care Teams.  These Care Teams include your primary Cardiologist (physician) and Advanced Practice Providers (APPs -  Physician Assistants and Nurse Practitioners) who all work together to provide you with the care you need, when you need it.  We recommend signing up for the patient portal called "MyChart".  Sign up information is provided on this After Visit Summary.  MyChart is used to connect with patients for Virtual Visits (Telemedicine).  Patients are able to view lab/test results, encounter notes, upcoming appointments, etc.  Non-urgent messages can be  sent to your provider as well.   To learn more about what you can do with MyChart, go to [email or web address].    Your next appointment:   3 month(s)  Provider:   Little Ishikawa, MD     Other Instructions Call Pulmonology to schedule appointment- [phone]  Recommend Omron upper arm blood pressure cuff  Please check your blood pressure at home twice daily, write it down.  Call the office or send message via Mychart with the readings in 1 week for Dr. Bjorn Pippin to review.

## 2022-12-15 LAB — BASIC METABOLIC PANEL
BUN/Creatinine Ratio: 12 (ref 9–20)
BUN: 16 mg/dL (ref 6–24)
CO2: 24 mmol/L (ref 20–29)
Calcium: 9.6 mg/dL (ref 8.7–10.2)
Chloride: 103 mmol/L (ref 96–106)
Creatinine, Ser: 1.32 mg/dL — ABNORMAL HIGH (ref 0.76–1.27)
Glucose: 84 mg/dL (ref 70–99)
Potassium: 4.1 mmol/L (ref 3.5–5.2)
Sodium: 140 mmol/L (ref 134–144)
eGFR: 64 mL/min/{1.73_m2} (ref >59)

## 2022-12-15 LAB — LIPID PANEL
Chol/HDL Ratio: 4.3 ratio (ref 0.0–5.0)
Cholesterol, Total: 200 mg/dL — ABNORMAL HIGH (ref 100–199)
HDL: 46 mg/dL (ref >39)
LDL Chol Calc (NIH): 132 mg/dL — ABNORMAL HIGH (ref 0–99)
Triglycerides: 120 mg/dL (ref 0–149)
VLDL Cholesterol Cal: 22 mg/dL (ref 5–40)

## 2023-01-05 ENCOUNTER — Ambulatory Visit (HOSPITAL_COMMUNITY): Payer: BC Managed Care – PPO | Attending: Cardiology

## 2023-01-05 DIAGNOSIS — R0602 Shortness of breath: Secondary | ICD-10-CM | POA: Diagnosis not present

## 2023-01-07 LAB — ECHOCARDIOGRAM COMPLETE
Area-P 1/2: 4.21 cm2
S' Lateral: 3.3 cm

## 2023-01-12 ENCOUNTER — Encounter: Payer: Self-pay | Admitting: *Deleted

## 2023-01-12 ENCOUNTER — Ambulatory Visit (INDEPENDENT_AMBULATORY_CARE_PROVIDER_SITE_OTHER): Payer: BC Managed Care – PPO | Admitting: Adult Health

## 2023-01-12 ENCOUNTER — Encounter: Payer: Self-pay | Admitting: Adult Health

## 2023-01-12 VITALS — BP 120/86 | HR 65 | Temp 98.1°F | Ht 67.0 in | Wt 181.0 lb

## 2023-01-12 DIAGNOSIS — M25551 Pain in right hip: Secondary | ICD-10-CM

## 2023-01-12 DIAGNOSIS — M25511 Pain in right shoulder: Secondary | ICD-10-CM | POA: Diagnosis not present

## 2023-01-12 MED ORDER — METHYLPREDNISOLONE 4 MG PO TBPK
ORAL_TABLET | ORAL | 0 refills | Status: DC
Start: 2023-01-12 — End: 2024-06-22

## 2023-01-12 NOTE — Progress Notes (Signed)
Subjective:    Patient ID: Marc Hancock, male    DOB: June 18, 1971, 52 y.o.   MRN: [id number]  Groin Pain  Shoulder Pain    52 year old male who  has a past medical history of Allergy, GERD (gastroesophageal reflux disease), and Hypertension.  He presents to the office today for multiple orthopedic issues.   Right groin pain - has been present for quite some time but for the last 2-3 months it has become worse. Lifting, walking and doing his work duties exacerbate the pain. At home pain is better but still present. He feels as though the pain is tight in nature. Ibuprofen helps ease the pain but does not resolve it. Pain radiates throughout hip.  2. Right shoulder pain - has been present for two months. Reports " I always feel like I have someone pressing on the front of my shoulder". Denies loss of ROM or grip strength. ROM does not make the pain any worse. Again, NSAIDS help but do not relieve the pain.   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      Review of Systems See HPI   Past Medical History:  Diagnosis Date   Allergy    GERD (gastroesophageal reflux disease)    Hypertension     Social History   Socioeconomic History   Marital status: Married    Spouse name: Not on file   Number of children: Not on file   Years of education: Not on file   Highest education level: Not on file  Occupational History   Not on file  Tobacco Use   Smoking status: Never    Passive exposure: Yes   Smokeless tobacco: Never  Vaping Use   Vaping Use: Never used  Substance and Sexual Activity   Alcohol use: Yes    Comment: occasionally   Drug use: Yes    Types: Marijuana    Comment: marijuana 2 x daily   Sexual activity: Yes    Partners: Female  Other Topics Concern   Not on file  Social History Narrative   He works at [employer]    He is married    Has 6 children - Age 78 to 6    Social Determinants of  Health   Financial Resource Strain: Not on file  Food Insecurity: Not on file  Transportation Needs: Not on file  Physical Activity: Not on file  Stress: Not on file  Social Connections: Not on file  Intimate Partner Violence: Not on file    Past Surgical History:  Procedure Laterality Date   HYDROCELE EXCISION Right 04/11/2013   Procedure: RIGHT HYDROCELECTOMY ADULT;  Surgeon: Lindaann Slough, MD;  Location: [facility];  Service: Urology;  Laterality: Right;   INGUINAL HERNIA REPAIR Right 04/11/2013   Procedure: HERNIA REPAIR INGUINAL ADULT;  Surgeon: Ernestene Mention, MD;  Location: [facility];  Service: General;  Laterality: Right;   VASECTOMY Bilateral 04/11/2013   Procedure: BILATERAL VASECTOMY;  Surgeon: Lindaann Slough, MD;  Location: [facility];  Service: Urology;  Laterality: Bilateral;    Family History  Problem Relation Age of Onset   Alcohol abuse Other    Arthritis Other    Heart disease Other    Stroke Other    Depression Other    Diabetes Other     No Known Allergies  Current Outpatient Medications on File Prior to Visit  Medication Sig  Dispense Refill   amLODipine (NORVASC) 5 MG tablet TAKE 2 TABLETS BY MOUTH EVERY DAY 180 tablet 1   aspirin 81 MG EC tablet TAKE 1 TABLET BY MOUTH EVERY DAY IN THE EVENING 60 tablet 0   losartan (COZAAR) 100 MG tablet TAKE 1 TABLET BY MOUTH EVERY DAY 90 tablet 0   omeprazole (PRILOSEC) 20 MG capsule Take 20 mg by mouth daily.     pravastatin (PRAVACHOL) 20 MG tablet TAKE 1 TABLET BY MOUTH EVERY DAY 90 tablet 3   No current facility-administered medications on file prior to visit.    BP 120/86   Pulse 65   Temp 98.1 F (36.7 C) (Oral)   Ht 5\' 7"  (1.702 m)   Wt 181 lb (82.1 kg)   SpO2 97%   BMI 28.35 kg/m       Objective:   Physical Exam Vitals and nursing note reviewed.  Constitutional:      Appearance: Normal appearance.  Pulmonary:     Effort: Pulmonary effort is normal.  Musculoskeletal:        General: Tenderness  present. No swelling.     Right shoulder: Tenderness and bony tenderness present. No swelling, deformity or crepitus. Normal range of motion. Normal strength.     Right hip: Tenderness present. No bony tenderness or crepitus. Normal range of motion. Normal strength.     Comments: No loss of ROM of right shoulder. Has pain with palpation to posterior aspect of right shoulder at the glenohumeral joint.   He has pain in his right groin/hip with straight leg raise, knee to chest and external/internal rotation. Pain stayed the same throughout ROM    Skin:    General: Skin is warm and dry.  Neurological:     General: No focal deficit present.     Mental Status: He is alert and oriented to person, place, and time.  Psychiatric:        Mood and Affect: Mood normal.        Behavior: Behavior normal.        Thought Content: Thought content normal.        Assessment & Plan:  1. Right hip pain - Osteoarthrtiis vs labral tear vs ligament/tendon strain. Will get Xray but likely need MRI  - DG HIP UNILAT W OR W/O PELVIS 2-3 VIEWS RIGHT; Future - methylPREDNISolone (MEDROL DOSEPAK) 4 MG TBPK tablet; Take as directed  Dispense: 21 tablet; Refill: 0  2. Acute pain of right shoulder - Will check xray today but this seems to be soft tissue strain from overuse.  - DG Shoulder Right; Future - methylPREDNISolone (MEDROL DOSEPAK) 4 MG TBPK tablet; Take as directed  Dispense: 21 tablet; Refill: 0  Shirline Frees, NP  Time spent with patient today was 32 minutes which consisted of chart review, discussing diagnosis, work up, treatment answering questions and documentation.

## 2023-01-15 ENCOUNTER — Other Ambulatory Visit: Payer: BC Managed Care – PPO

## 2023-01-15 ENCOUNTER — Ambulatory Visit (INDEPENDENT_AMBULATORY_CARE_PROVIDER_SITE_OTHER): Payer: BC Managed Care – PPO

## 2023-01-15 DIAGNOSIS — M25551 Pain in right hip: Secondary | ICD-10-CM

## 2023-01-15 DIAGNOSIS — M25511 Pain in right shoulder: Secondary | ICD-10-CM | POA: Diagnosis not present

## 2023-02-03 ENCOUNTER — Ambulatory Visit: Payer: BC Managed Care – PPO | Admitting: Adult Health

## 2023-02-14 ENCOUNTER — Other Ambulatory Visit: Payer: Self-pay | Admitting: Adult Health

## 2023-02-14 DIAGNOSIS — I10 Essential (primary) hypertension: Secondary | ICD-10-CM

## 2023-03-20 ENCOUNTER — Other Ambulatory Visit: Payer: Self-pay | Admitting: Adult Health

## 2023-03-20 DIAGNOSIS — I10 Essential (primary) hypertension: Secondary | ICD-10-CM

## 2023-03-21 NOTE — Progress Notes (Deleted)
Cardiology Office Note:    Date:  03/21/2023   ID:  Marc Hancock, DOB [birth year], MRN [id number]  PCP:  Shirline Frees, NP  Cardiologist:  Little Ishikawa, MD  Electrophysiologist:  None   Referring MD: Shirline Frees, NP   No chief complaint on file.   History of Present Illness:    Marc Hancock is a 52 y.o. male with a hx of GERD, hypertension who presents for follow-up.  He was referred by Shirline Frees, NP for an evaluation of chest pain, initially seen 10/02/2019.  Reports chest pain started 2 months prior..  States that it occurs across his whole chest.  Describes as tightness in chest, 6-7/10 in intensity.  Has been occurring about every 2 weeks or so.  Last couple of minutes and resolves spontaneously.  Has not noted what brings it on.  He does not think there is any relationship with exertion, but does think it occurs more frequently during times of stress.  No smoking history.  Denies any heart disease in his immediate family.  Reports that he has not been exercising since the pandemic started.  Prior to this was playing basketball at the [facility].  Reports BP has not been well controlled, recently increased amlodipine to 10 mg daily and BP has been much better controlled since that time.  Coronary CTA 11/17/2019 shows nonobstructive CAD (less than 25% stenosis in mid LAD), calcium score 0.  Echocardiogram 01/07/2023 showed EF 55 to 60%, normal RV function, mild left atrial enlargement, no significant valvular disease.  Since last clinic visit, he reports he is doing okay.  States that chest pain has improved.  Continues to have shortness of breath.  Also having lightheadedness but denies any syncope.  No recent palpitations.  Denies any lower extremity edema.  Past Medical History:  Diagnosis Date   Allergy    GERD (gastroesophageal reflux disease)    Hypertension     Past Surgical History:  Procedure Laterality Date   HYDROCELE EXCISION Right 04/11/2013   Procedure: RIGHT  HYDROCELECTOMY ADULT;  Surgeon: Lindaann Slough, MD;  Location: [facility];  Service: Urology;  Laterality: Right;   INGUINAL HERNIA REPAIR Right 04/11/2013   Procedure: HERNIA REPAIR INGUINAL ADULT;  Surgeon: Ernestene Mention, MD;  Location: [facility];  Service: General;  Laterality: Right;   VASECTOMY Bilateral 04/11/2013   Procedure: BILATERAL VASECTOMY;  Surgeon: Lindaann Slough, MD;  Location: [facility];  Service: Urology;  Laterality: Bilateral;    Current Medications: No outpatient medications have been marked as taking for the 03/23/23 encounter (Appointment) with Little Ishikawa, MD.     Allergies:   Patient has no known allergies.   Social History   Socioeconomic History   Marital status: Married    Spouse name: Not on file   Number of children: Not on file   Years of education: Not on file   Highest education level: Not on file  Occupational History   Not on file  Tobacco Use   Smoking status: Never    Passive exposure: Yes   Smokeless tobacco: Never  Vaping Use   Vaping status: Never Used  Substance and Sexual Activity   Alcohol use: Yes    Comment: occasionally   Drug use: Yes    Types: Marijuana    Comment: marijuana 2 x daily   Sexual activity: Yes    Partners: Female  Other Topics Concern   Not on file  Social History Narrative   He works at [employer]  company    He is married    Has 6 children - Age 7 to 70    Social Determinants of Corporate investment banker Strain: Not on file  Food Insecurity: Not on file  Transportation Needs: Not on file  Physical Activity: Not on file  Stress: Not on file  Social Connections: Not on file     Family History: The patient's family history includes Alcohol abuse in an other family member; Arthritis in an other family member; Depression in an other family member; Diabetes in an other family member; Heart disease in an other family member; Stroke in an other family member.  ROS:   Please see the history of  present illness.     All other systems reviewed and are negative.  EKGs/Labs/Other Studies Reviewed:    The following studies were reviewed today:   EKG:  EKG is ordered today.  The ekg ordered today demonstrates sinus rhythm, rate 85, no ST/T abnormalities  Recent Labs: 09/03/2022: ALT 41; Hemoglobin 14.3; Platelets 225.0 12/14/2022: BUN 16; Creatinine, Ser 1.32; Potassium 4.1; Sodium 140  Recent Lipid Panel    Component Value Date/Time   CHOL 200 (H) 12/14/2022 0929   TRIG 120 12/14/2022 0929   HDL 46 12/14/2022 0929   CHOLHDL 4.3 12/14/2022 0929   CHOLHDL 4 12/15/2021 0920   VLDL 24.2 12/15/2021 0920   LDLCALC 132 (H) 12/14/2022 0929    Physical Exam:    VS:  There were no vitals taken for this visit.    Wt Readings from Last 3 Encounters:  01/12/23 181 lb (82.1 kg)  12/14/22 178 lb (80.7 kg)  09/11/22 176 lb (79.8 kg)     GEN:  Well nourished, well developed in no acute distress HEENT: Normal NECK: No JVD LYMPHATICS: No lymphadenopathy CARDIAC: RRR, no murmurs, rubs, gallops RESPIRATORY:  Clear to auscultation without rales, wheezing or rhonchi  ABDOMEN: Soft, non-tender, non-distended MUSCULOSKELETAL:  No edema; No deformity  SKIN: Warm and dry NEUROLOGIC:  Alert and oriented x 3 PSYCHIATRIC:  Normal affect   ASSESSMENT:    No diagnosis found.   PLAN:    In order of problems listed above:  CAD: Reported atypical chest pain.  Coronary CTA 11/17/2019 shows nonobstructive CAD (less than 25% stenosis in mid LAD), calcium score 0. -Continue statin  Hypertension: On amlodipine 10 mg daily and losartan 100 mg daily.  BP elevated in clinic today but reports did not take his medications.  Asked to check BP twice daily for next week and call with results  Hyperlipidemia: On pravastatin 20 mg daily, LDL was 132 on 12/14/2022.  Switch to rosuvastatin 10 mg daily***  Chronic shortness of breath: Echocardiogram 01/07/2023 showed EF 55 to 60%, normal RV function, mild  left atrial enlargement, no significant valvular disease.  Seen by pulmonology in 2020.  Sleep study, PFTs recommended but he did not follow-up.  Recommend follow-up with pulmonology***  RTC in 3 months***  Medication Adjustments/Labs and Tests Ordered: Current medicines are reviewed at length with the patient today.  Concerns regarding medicines are outlined above.  No orders of the defined types were placed in this encounter.  No orders of the defined types were placed in this encounter.   There are no Patient Instructions on file for this visit.   Signed, Little Ishikawa, MD  03/21/2023 6:19 PM    [facility]

## 2023-03-23 ENCOUNTER — Ambulatory Visit: Payer: BC Managed Care – PPO | Admitting: Cardiology

## 2023-04-25 NOTE — Progress Notes (Deleted)
Cardiology Clinic Note   Patient Name: Marc Hancock Date of Encounter: 04/25/2023  Primary Care Provider:  Shirline Frees, NP Primary Cardiologist:  Little Ishikawa, MD  Patient Profile    Marc Hancock 52 year old male presents to the clinic today for follow-up evaluation of his chest discomfort and hypertension.  Past Medical History    Past Medical History:  Diagnosis Date   Allergy    GERD (gastroesophageal reflux disease)    Hypertension    Past Surgical History:  Procedure Laterality Date   HYDROCELE EXCISION Right 04/11/2013   Procedure: RIGHT HYDROCELECTOMY ADULT;  Surgeon: Lindaann Slough, MD;  Location: [facility];  Service: Urology;  Laterality: Right;   INGUINAL HERNIA REPAIR Right 04/11/2013   Procedure: HERNIA REPAIR INGUINAL ADULT;  Surgeon: Ernestene Mention, MD;  Location: [facility];  Service: General;  Laterality: Right;   VASECTOMY Bilateral 04/11/2013   Procedure: BILATERAL VASECTOMY;  Surgeon: Lindaann Slough, MD;  Location: [facility];  Service: Urology;  Laterality: Bilateral;    Allergies  No Known Allergies  History of Present Illness    [name] has a PMH of hypertension, GERD, chest discomfort.  He was initially referred by his PCP for evaluation of chest pain.  He reported tightness and a 6-7 out of 10 intensity.  He would notice that his discomfort would occur about every 2 weeks and last for a couple minutes.  It would then spontaneously resolved.  It was not exertional.  He did note chest discomfort with increased stress.  He denied family heart disease.  His blood pressure has been well-controlled on amlodipine.  He had a coronary CTA 3/21 which showed nonobstructive coronary disease with less than 25% stenosis in his mid LAD.  His coronary calcium score was 0.  Echocardiogram 5/24 showed an LVEF of 55-60%, mild left atrial enlargement, and no significant valvular disease.  He was seen in follow-up by Dr. Bjorn Pippin on 12/14/2022.  During that time  he reported that he was doing okay.  His chest pain had improved.  He continued to note some shortness of breath.  He also noted some lightheadedness but denied syncope.  He denied palpitations.  He denied lower extremity swelling.  His blood pressure was noted to be 138/92 with a pulse of 68.  His lipid panel 12/14/2022 showed an LDL cholesterol of 132.  It was recommended that he switch from pravastatin to rosuvastatin and recheck lipid panel in 2 months.  His echocardiogram 01/05/2023 showed normal LV function, normal diastolic parameters, trivial mitral valve regurgitation, and mild calcification of his aortic valve.  Mildly dilated left atria was also noted.  He presents to the clinic today for follow-up evaluation and states***.  *** denies chest pain, shortness of breath, lower extremity edema, fatigue, palpitations, melena, hematuria, hemoptysis, diaphoresis, weakness, presyncope, syncope, orthopnea, and PND.  Chronic shortness of breath-stable.  Echocardiogram 5/24 showed normal LVEF and diastolic parameters.  He was noted to have mild calcification of his aortic valve and mildly dilated left atria.  Continues to be somewhat sedentary. Increase physical activity as tolerated Reassured that his shortness of breath does not appear to be related to his heart/cardiac issues.  Coronary artery disease-no chest pain today.  Denies recent episodes of arm neck back or chest discomfort.  Underwent coronary CTA 3/21 which showed mild nonobstructive coronary disease with less than 25% stenosis in his mid LAD. Continue rosuvastatin Heart healthy low-sodium diet Increase physical activity as tolerated  Hyperlipidemia-LDL 132 on 12/14/2022.  He was transition from pravastatin to rosuvastatin at that time. High-fiber diet Continue rosuvastatin Increase physical activity as tolerated Repeat fasting lipids and LFTs  Essential hypertension-BP today***. Maintain blood pressure log Continue amlodipine,  losartan Heart healthy low-sodium diet-salty 6 given  Disposition: Follow-up with Dr. Bjorn Pippin or me in 6 months.  Home Medications    Prior to Admission medications   Medication Sig Start Date End Date Taking? Authorizing Provider  amLODipine (NORVASC) 5 MG tablet TAKE 2 TABLETS BY MOUTH EVERY DAY 03/22/23   Nafziger, Kandee Keen, NP  aspirin 81 MG EC tablet TAKE 1 TABLET BY MOUTH EVERY DAY IN THE EVENING 11/03/17   Nafziger, Kandee Keen, NP  losartan (COZAAR) 100 MG tablet TAKE 1 TABLET BY MOUTH EVERY DAY 02/15/23   Nafziger, Kandee Keen, NP  methylPREDNISolone (MEDROL DOSEPAK) 4 MG TBPK tablet Take as directed 01/12/23   Shirline Frees, NP  omeprazole (PRILOSEC) 20 MG capsule Take 20 mg by mouth daily.    [provider]  pravastatin (PRAVACHOL) 20 MG tablet TAKE 1 TABLET BY MOUTH EVERY DAY 03/06/20   Nafziger, Kandee Keen, NP    Family History    Family History  Problem Relation Age of Onset   Alcohol abuse Other    Arthritis Other    Heart disease Other    Stroke Other    Depression Other    Diabetes Other    He indicated that his mother is deceased. He indicated that his father is alive. He indicated that the status of his other is unknown.  Social History    Social History   Socioeconomic History   Marital status: Married    Spouse name: Not on file   Number of children: Not on file   Years of education: Not on file   Highest education level: Not on file  Occupational History   Not on file  Tobacco Use   Smoking status: Never    Passive exposure: Yes   Smokeless tobacco: Never  Vaping Use   Vaping status: Never Used  Substance and Sexual Activity   Alcohol use: Yes    Comment: occasionally   Drug use: Yes    Types: Marijuana    Comment: marijuana 2 x daily   Sexual activity: Yes    Partners: Female  Other Topics Concern   Not on file  Social History Narrative   He works at [employer]    He is married    Has 6 children - Age 68 to 6    Social Determinants of Health    Financial Resource Strain: Not on file  Food Insecurity: Not on file  Transportation Needs: Not on file  Physical Activity: Not on file  Stress: Not on file  Social Connections: Not on file  Intimate Partner Violence: Not on file     Review of Systems    General:  No chills, fever, night sweats or weight changes.  Cardiovascular:  No chest pain, dyspnea on exertion, edema, orthopnea, palpitations, paroxysmal nocturnal dyspnea. Dermatological: No rash, lesions/masses Respiratory: No cough, dyspnea Urologic: No hematuria, dysuria Abdominal:   No nausea, vomiting, diarrhea, bright red blood per rectum, melena, or hematemesis Neurologic:  No visual changes, wkns, changes in mental status. All other systems reviewed and are otherwise negative except as noted above.  Physical Exam    VS:  There were no vitals taken for this visit. , BMI There is no height or weight on file to calculate BMI. GEN: Well nourished, well developed, in no  acute distress. HEENT: normal. Neck: Supple, no JVD, carotid bruits, or masses. Cardiac: RRR, no murmurs, rubs, or gallops. No clubbing, cyanosis, edema.  Radials/DP/PT 2+ and equal bilaterally.  Respiratory:  Respirations regular and unlabored, clear to auscultation bilaterally. GI: Soft, nontender, nondistended, BS + x 4. MS: no deformity or atrophy. Skin: warm and dry, no rash. Neuro:  Strength and sensation are intact. Psych: Normal affect.  Accessory Clinical Findings    Recent Labs: 09/03/2022: ALT 41; Hemoglobin 14.3; Platelets 225.0 12/14/2022: BUN 16; Creatinine, Ser 1.32; Potassium 4.1; Sodium 140   Recent Lipid Panel    Component Value Date/Time   CHOL 200 (H) 12/14/2022 0929   TRIG 120 12/14/2022 0929   HDL 46 12/14/2022 0929   CHOLHDL 4.3 12/14/2022 0929   CHOLHDL 4 12/15/2021 0920   VLDL 24.2 12/15/2021 0920   LDLCALC 132 (H) 12/14/2022 0929    No BP recorded.  {Refresh Note OR Click here to enter BP  :1}***    ECG  personally reviewed by me today- ***     Echocardiogram 01/05/2023  IMPRESSIONS     1. Left ventricular ejection fraction, by estimation, is 55 to 60%. The  left ventricle has normal function. The left ventricle has no regional  wall motion abnormalities. Left ventricular diastolic parameters were  normal. The average left ventricular  global longitudinal strain is -18.8 %. The global longitudinal strain is  normal.   2. Right ventricular systolic function is normal. The right ventricular  size is normal. There is normal pulmonary artery systolic pressure.   3. Left atrial size was mildly dilated.   4. The mitral valve is normal in structure. Trivial mitral valve  regurgitation. No evidence of mitral stenosis.   5. The aortic valve is tricuspid. There is mild calcification of the  aortic valve. Aortic valve regurgitation is not visualized. Aortic valve  sclerosis is present, with no evidence of aortic valve stenosis.   6. The inferior vena cava is normal in size with greater than 50%  respiratory variability, suggesting right atrial pressure of 3 mmHg.   Comparison(s): No prior Echocardiogram.   Conclusion(s)/Recommendation(s): Normal biventricular function without  evidence of hemodynamically significant valvular heart disease.   FINDINGS   Left Ventricle: Left ventricular ejection fraction, by estimation, is 55  to 60%. The left ventricle has normal function. The left ventricle has no  regional wall motion abnormalities. The average left ventricular global  longitudinal strain is -18.8 %.  The global longitudinal strain is normal. 3D ejection fraction reviewed  and evaluated as part of the interpretation. Alternate measurement of EF  is felt to be most reflective of LV function. The left ventricular  internal cavity size was normal in size.  There is no left ventricular hypertrophy. Left ventricular diastolic  parameters were normal.   Right Ventricle: The right ventricular  size is normal. No increase in  right ventricular wall thickness. Right ventricular systolic function is  normal. There is normal pulmonary artery systolic pressure. The tricuspid  regurgitant velocity is 1.95 m/s, and   with an assumed right atrial pressure of 3 mmHg, the estimated right  ventricular systolic pressure is 18.2 mmHg.   Left Atrium: Left atrial size was mildly dilated.   Right Atrium: Right atrial size was normal in size.   Pericardium: There is no evidence of pericardial effusion.   Mitral Valve: The mitral valve is normal in structure. Trivial mitral  valve regurgitation. No evidence of mitral valve stenosis.   Tricuspid Valve:  The tricuspid valve is normal in structure. Tricuspid  valve regurgitation is trivial. No evidence of tricuspid stenosis.   Aortic Valve: The aortic valve is tricuspid. There is mild calcification  of the aortic valve. Aortic valve regurgitation is not visualized. Aortic  valve sclerosis is present, with no evidence of aortic valve stenosis.   Pulmonic Valve: The pulmonic valve was grossly normal. Pulmonic valve  regurgitation is trivial. No evidence of pulmonic stenosis.   Aorta: The aortic root, ascending aorta, aortic arch and descending aorta  are all structurally normal, with no evidence of dilitation or  obstruction.   Venous: The inferior vena cava is normal in size with greater than 50%  respiratory variability, suggesting right atrial pressure of 3 mmHg.   IAS/Shunts: The atrial septum is grossly normal.       Assessment & Plan   1.  ***   Thomasene Ripple. Macarena Langseth NP-C     04/25/2023, 2:57 PM [facility] [address] Office [phone] Fax [phone]    I spent***minutes examining this patient, reviewing medications, and using patient centered shared decision making involving her cardiac care.  Prior to her visit I spent greater than 20 minutes reviewing her past medical history,   medications, and prior cardiac tests.

## 2023-04-27 ENCOUNTER — Ambulatory Visit: Payer: BC Managed Care – PPO | Admitting: General Practice

## 2023-05-30 ENCOUNTER — Other Ambulatory Visit: Payer: Self-pay | Admitting: Adult Health

## 2023-05-30 DIAGNOSIS — I10 Essential (primary) hypertension: Secondary | ICD-10-CM

## 2023-09-08 ENCOUNTER — Other Ambulatory Visit: Payer: Self-pay | Admitting: Adult Health

## 2023-09-08 DIAGNOSIS — I10 Essential (primary) hypertension: Secondary | ICD-10-CM

## 2023-09-08 NOTE — Telephone Encounter (Signed)
 Patient need to schedule CPE for more refills.

## 2023-09-21 ENCOUNTER — Ambulatory Visit: Payer: Self-pay | Admitting: Adult Health

## 2023-09-28 ENCOUNTER — Other Ambulatory Visit: Payer: Self-pay | Admitting: Adult Health

## 2023-09-28 DIAGNOSIS — I10 Essential (primary) hypertension: Secondary | ICD-10-CM

## 2023-10-16 ENCOUNTER — Other Ambulatory Visit: Payer: Self-pay | Admitting: Adult Health

## 2023-10-16 DIAGNOSIS — I10 Essential (primary) hypertension: Secondary | ICD-10-CM

## 2023-11-05 ENCOUNTER — Other Ambulatory Visit: Payer: Self-pay

## 2023-11-05 ENCOUNTER — Emergency Department (HOSPITAL_COMMUNITY)
Admission: EM | Admit: 2023-11-05 | Discharge: 2023-11-05 | Disposition: A | Discharge status: Home / Self Care | Payer: Self-pay | Attending: Emergency Medicine | Admitting: Emergency Medicine

## 2023-11-05 ENCOUNTER — Encounter (HOSPITAL_COMMUNITY): Payer: Self-pay

## 2023-11-05 DIAGNOSIS — R030 Elevated blood-pressure reading, without diagnosis of hypertension: Secondary | ICD-10-CM

## 2023-11-05 DIAGNOSIS — Z7982 Long term (current) use of aspirin: Secondary | ICD-10-CM | POA: Insufficient documentation

## 2023-11-05 DIAGNOSIS — I10 Essential (primary) hypertension: Secondary | ICD-10-CM | POA: Insufficient documentation

## 2023-11-05 DIAGNOSIS — R197 Diarrhea, unspecified: Secondary | ICD-10-CM

## 2023-11-05 DIAGNOSIS — R112 Nausea with vomiting, unspecified: Secondary | ICD-10-CM

## 2023-11-05 DIAGNOSIS — Z79899 Other long term (current) drug therapy: Secondary | ICD-10-CM | POA: Insufficient documentation

## 2023-11-05 DIAGNOSIS — E860 Dehydration: Secondary | ICD-10-CM | POA: Insufficient documentation

## 2023-11-05 LAB — URINALYSIS, ROUTINE W REFLEX MICROSCOPIC
Bilirubin Urine: NEGATIVE
Glucose, UA: NEGATIVE mg/dL
Hgb urine dipstick: NEGATIVE
Ketones, ur: 5 mg/dL — AB
Leukocytes,Ua: NEGATIVE
Nitrite: NEGATIVE
Protein, ur: NEGATIVE mg/dL
Specific Gravity, Urine: 1.014 (ref 1.005–1.030)
pH: 9 — ABNORMAL HIGH (ref 5.0–8.0)

## 2023-11-05 LAB — COMPREHENSIVE METABOLIC PANEL
ALT: 26 U/L (ref 0–44)
AST: 37 U/L (ref 15–41)
Albumin: 4.3 g/dL (ref 3.5–5.0)
Alkaline Phosphatase: 85 U/L (ref 38–126)
Anion gap: 16 — ABNORMAL HIGH (ref 5–15)
BUN: 15 mg/dL (ref 6–20)
CO2: 19 mmol/L — ABNORMAL LOW (ref 22–32)
Calcium: 9.8 mg/dL (ref 8.9–10.3)
Chloride: 102 mmol/L (ref 98–111)
Creatinine, Ser: 1.44 mg/dL — ABNORMAL HIGH (ref 0.61–1.24)
GFR, Estimated: 58 mL/min — ABNORMAL LOW (ref >60)
Glucose, Bld: 99 mg/dL (ref 70–99)
Potassium: 3.7 mmol/L (ref 3.5–5.1)
Sodium: 137 mmol/L (ref 135–145)
Total Bilirubin: 1.2 mg/dL (ref 0.0–1.2)
Total Protein: 7.6 g/dL (ref 6.5–8.1)

## 2023-11-05 LAB — RAPID URINE DRUG SCREEN, HOSP PERFORMED
Amphetamines: NOT DETECTED
Barbiturates: NOT DETECTED
Benzodiazepines: NOT DETECTED
Cocaine: NOT DETECTED
Opiates: POSITIVE — AB
Tetrahydrocannabinol: POSITIVE — AB

## 2023-11-05 LAB — RESP PANEL BY RT-PCR (RSV, FLU A&B, COVID)  RVPGX2
Influenza A by PCR: NEGATIVE
Influenza B by PCR: NEGATIVE
Resp Syncytial Virus by PCR: NEGATIVE
SARS Coronavirus 2 by RT PCR: NEGATIVE

## 2023-11-05 LAB — CBC
HCT: 38.1 % — ABNORMAL LOW (ref 39.0–52.0)
Hemoglobin: 13.2 g/dL (ref 13.0–17.0)
MCH: 30.3 pg (ref 26.0–34.0)
MCHC: 34.6 g/dL (ref 30.0–36.0)
MCV: 87.4 fL (ref 80.0–100.0)
Platelets: 240 10*3/uL (ref 150–400)
RBC: 4.36 MIL/uL (ref 4.22–5.81)
RDW: 12.9 % (ref 11.5–15.5)
WBC: 11.9 10*3/uL — ABNORMAL HIGH (ref 4.0–10.5)
nRBC: 0 % (ref 0.0–0.2)

## 2023-11-05 LAB — LIPASE, BLOOD: Lipase: 22 U/L (ref 11–51)

## 2023-11-05 MED ORDER — ONDANSETRON 8 MG PO TBDP: 8.0000 mg | ORAL_TABLET | Freq: Three times a day (TID) | ORAL | 0 refills | Status: DC | PRN

## 2023-11-05 MED ORDER — ONDANSETRON HCL 4 MG/2ML IJ SOLN
4.0000 mg | Freq: Once | INTRAMUSCULAR | Status: AC
  Administered 2023-11-05: 4 mg via INTRAVENOUS
  Filled 2023-11-05: qty 2

## 2023-11-05 MED ORDER — MORPHINE SULFATE (PF) 4 MG/ML IV SOLN
4.0000 mg | Freq: Once | INTRAVENOUS | Status: AC
  Administered 2023-11-05: 4 mg via INTRAVENOUS
  Filled 2023-11-05: qty 1

## 2023-11-05 MED ORDER — LACTATED RINGERS IV BOLUS
1000.0000 mL | Freq: Once | INTRAVENOUS | Status: AC
  Administered 2023-11-05: 1000 mL via INTRAVENOUS

## 2023-11-05 MED ORDER — PANTOPRAZOLE SODIUM 40 MG IV SOLR
40.0000 mg | Freq: Once | INTRAVENOUS | Status: AC
  Administered 2023-11-05: 40 mg via INTRAVENOUS
  Filled 2023-11-05: qty 10

## 2023-11-05 NOTE — ED Provider Notes (Signed)
 [place] EMERGENCY DEPARTMENT AT [facility] Provider Note   CSN: [id number] Arrival date & time: 11/05/23  4098     History  Chief Complaint  Patient presents with  . Emesis    Josemaria Brining is a 53 y.o. male.  Pt with c/o nausea, vomiting, epigastric pain, and diarrhea since last evening. Multiple episodes of emesis, not bloody or bilious. Epigastric pain dull, cramping, non radiating. No specific known ill contacts or bad food exposure. No cough or uri symptoms. No chest pain or sob. No extremity pain or swelling. No fever or chills.   The history is provided by the patient, medical records and a significant other.  Emesis Associated symptoms: abdominal pain and diarrhea   Associated symptoms: no cough, no fever, no headaches and no sore throat        Home Medications Prior to Admission medications   Medication Sig Start Date End Date Taking? Authorizing Provider  amLODipine (NORVASC) 5 MG tablet TAKE 2 TABLETS BY MOUTH EVERY DAY 10/19/23   Nafziger, Kandee Keen, NP  aspirin 81 MG EC tablet TAKE 1 TABLET BY MOUTH EVERY DAY IN THE EVENING 11/03/17   Nafziger, Kandee Keen, NP  losartan (COZAAR) 100 MG tablet TAKE 1 TABLET BY MOUTH EVERY DAY 09/08/23   Nafziger, Kandee Keen, NP  methylPREDNISolone (MEDROL DOSEPAK) 4 MG TBPK tablet Take as directed 01/12/23   Shirline Frees, NP  omeprazole (PRILOSEC) 20 MG capsule Take 20 mg by mouth daily.    [provider]  pravastatin (PRAVACHOL) 20 MG tablet TAKE 1 TABLET BY MOUTH EVERY DAY 03/06/20   Shirline Frees, NP      Allergies    Patient has no known allergies.    Review of Systems   Review of Systems  Constitutional:  Negative for fever.  HENT:  Negative for sore throat.   Eyes:  Negative for redness.  Respiratory:  Negative for cough and shortness of breath.   Cardiovascular:  Negative for chest pain.  Gastrointestinal:  Positive for abdominal pain, diarrhea, nausea and vomiting.  Genitourinary:  Negative for dysuria and flank  pain.  Musculoskeletal:  Negative for back pain and neck pain.  Skin:  Negative for rash.  Neurological:  Negative for headaches.    Physical Exam Updated Vital Signs BP (!) 149/94   Pulse 93   Temp 98.7 F (37.1 C) (Oral)   Resp 16   Ht 1.702 m (5\' 7" )   Wt 78 kg   SpO2 100%   BMI 26.94 kg/m  Physical Exam Vitals and nursing note reviewed.  Constitutional:      Appearance: Normal appearance. He is well-developed.  HENT:     Head: Atraumatic.     Nose: Nose normal.     Mouth/Throat:     Mouth: Mucous membranes are moist.     Pharynx: Oropharynx is clear. No oropharyngeal exudate or posterior oropharyngeal erythema.  Eyes:     General: No scleral icterus.    Conjunctiva/sclera: Conjunctivae normal.  Neck:     Trachea: No tracheal deviation.  Cardiovascular:     Rate and Rhythm: Normal rate and regular rhythm.     Pulses: Normal pulses.     Heart sounds: Normal heart sounds. No murmur heard.    No friction rub. No gallop.  Pulmonary:     Effort: Pulmonary effort is normal. No accessory muscle usage or respiratory distress.     Breath sounds: Normal breath sounds.  Abdominal:     General: Bowel sounds are normal.  There is no distension.     Palpations: Abdomen is soft. There is no mass.     Tenderness: There is abdominal tenderness. There is no guarding.     Hernia: No hernia is present.     Comments: Epigastric tenderness.   Genitourinary:    Comments: No cva tenderness. Musculoskeletal:        General: No swelling or tenderness.     Cervical back: Normal range of motion and neck supple. No rigidity.     Right lower leg: No edema.     Left lower leg: No edema.  Skin:    General: Skin is warm and dry.     Findings: No rash.  Neurological:     Mental Status: He is alert.     Comments: Alert, speech clear. Motor/sens grossly intact bil.   Psychiatric:        Mood and Affect: Mood normal.    ED Results / Procedures / Treatments   Labs (all labs ordered are  listed, but only abnormal results are displayed) Results for orders placed or performed during the hospital encounter of 11/05/23  Resp panel by RT-PCR (RSV, Flu A&B, Covid) Anterior Nasal Swab   Collection Time: 11/05/23  8:03 AM   Specimen: Anterior Nasal Swab  Result Value Ref Range   SARS Coronavirus 2 by RT PCR NEGATIVE NEGATIVE   Influenza A by PCR NEGATIVE NEGATIVE   Influenza B by PCR NEGATIVE NEGATIVE   Resp Syncytial Virus by PCR NEGATIVE NEGATIVE  Lipase, blood   Collection Time: 11/05/23  8:30 AM  Result Value Ref Range   Lipase 22 11 - 51 U/L  Comprehensive metabolic panel   Collection Time: 11/05/23  8:30 AM  Result Value Ref Range   Sodium 137 135 - 145 mmol/L   Potassium 3.7 3.5 - 5.1 mmol/L   Chloride 102 98 - 111 mmol/L   CO2 19 (L) 22 - 32 mmol/L   Glucose, Bld 99 70 - 99 mg/dL   BUN 15 6 - 20 mg/dL   Creatinine, Ser 3.29 (H) 0.61 - 1.24 mg/dL   Calcium 9.8 8.9 - 51.8 mg/dL   Total Protein 7.6 6.5 - 8.1 g/dL   Albumin 4.3 3.5 - 5.0 g/dL   AST 37 15 - 41 U/L   ALT 26 0 - 44 U/L   Alkaline Phosphatase 85 38 - 126 U/L   Total Bilirubin 1.2 0.0 - 1.2 mg/dL   GFR, Estimated 58 (L) >60 mL/min   Anion gap 16 (H) 5 - 15  CBC   Collection Time: 11/05/23  8:30 AM  Result Value Ref Range   WBC 11.9 (H) 4.0 - 10.5 K/uL   RBC 4.36 4.22 - 5.81 MIL/uL   Hemoglobin 13.2 13.0 - 17.0 g/dL   HCT 84.1 (L) 66.0 - 63.0 %   MCV 87.4 80.0 - 100.0 fL   MCH 30.3 26.0 - 34.0 pg   MCHC 34.6 30.0 - 36.0 g/dL   RDW 16.0 10.9 - 32.3 %   Platelets 240 150 - 400 K/uL   nRBC 0.0 0.0 - 0.2 %  Urinalysis, Routine w reflex microscopic -Urine, Clean Catch   Collection Time: 11/05/23  9:55 AM  Result Value Ref Range   Color, Urine YELLOW YELLOW   APPearance CLEAR CLEAR   Specific Gravity, Urine 1.014 1.005 - 1.030   pH 9.0 (H) 5.0 - 8.0   Glucose, UA NEGATIVE NEGATIVE mg/dL   Hgb urine dipstick NEGATIVE NEGATIVE   Bilirubin  Urine NEGATIVE NEGATIVE   Ketones, ur 5 (A) NEGATIVE  mg/dL   Protein, ur NEGATIVE NEGATIVE mg/dL   Nitrite NEGATIVE NEGATIVE   Leukocytes,Ua NEGATIVE NEGATIVE  Rapid urine drug screen (hospital performed)   Collection Time: 11/05/23  9:55 AM  Result Value Ref Range   Opiates POSITIVE (A) NONE DETECTED   Cocaine NONE DETECTED NONE DETECTED   Benzodiazepines NONE DETECTED NONE DETECTED   Amphetamines NONE DETECTED NONE DETECTED   Tetrahydrocannabinol POSITIVE (A) NONE DETECTED   Barbiturates NONE DETECTED NONE DETECTED      EKG None  Radiology No results found.  Procedures Procedures    Medications Ordered in ED Medications  lactated ringers bolus 1,000 mL (1,000 mLs Intravenous New Bag/Given 11/05/23 0822)  ondansetron (ZOFRAN) injection 4 mg (4 mg Intravenous Given 11/05/23 0812)  pantoprazole (PROTONIX) injection 40 mg (40 mg Intravenous Given 11/05/23 0816)  morphine (PF) 4 MG/ML injection 4 mg (4 mg Intravenous Given 11/05/23 0814)  lactated ringers bolus 1,000 mL (1,000 mLs Intravenous New Bag/Given 11/05/23 1053)    ED Course/ Medical Decision Making/ A&P                                 Medical Decision Making Problems Addressed: Dehydration: acute illness or injury with systemic symptoms that poses a threat to life or bodily functions Diarrhea, unspecified type: acute illness or injury Elevated blood pressure reading: acute illness or injury Essential hypertension: chronic illness or injury with exacerbation, progression, or side effects of treatment that poses a threat to life or bodily functions Nausea and vomiting in adult: acute illness or injury with systemic symptoms  Amount and/or Complexity of Data Reviewed Independent Historian:     Details: Family, hx External Data Reviewed: notes. Labs: ordered. Decision-making details documented in ED Course.  Risk Prescription drug management. Parenteral controlled substances. Decision regarding hospitalization.   Iv ns. Continuous pulse ox and cardiac monitoring.  Labs ordered/sent.   Differential diagnosis includes gastroenteritis, food poisoning, viral syndrome, dehydration, chs, etc. Dispo decision including potential need for admission considered - will get labs and reassess.   Reviewed nursing notes and prior charts for additional history. External reports reviewed. Additional history from:  Cardiac monitor: sinus rhythm, rate 100.  LR bolus. Protonix iv, zofran iv, morphine iv.   Labs reviewed/interpreted by me - wbc 11, hgb 13. Ua with small ketones, no uti. Chem w hco3 sl low, c/w dehydration. Additional ivf.   Po fluids.  Recheck abd soft non tender. No recurrent emesis.   Recheck again, feels improved. No abd pain or  tenderness. No recurrent nvd.   Pt currently appears stable for d/c.   Rec close pcp f/u.  Return precautions provided.           Final Clinical Impression(s) / ED Diagnoses Final diagnoses:  Nausea and vomiting in adult  Diarrhea, unspecified type  Dehydration  Elevated blood pressure reading  Essential hypertension    Rx / DC Orders ED Discharge Orders     None         Cathren Laine, MD 11/05/23 1151

## 2023-11-05 NOTE — ED Triage Notes (Signed)
 Pt here for abd pain, vomiting, and diarrhea since 8pm last night. Denies fevers. C/O dizziness.

## 2023-11-05 NOTE — Discharge Instructions (Addendum)
 It was our pleasure to provide your ER care today - we hope that you feel better.  Drink plenty of fluids/stay well hydrated. Take zofran as need for nausea.   Follow up closely with primary care doctor in 1-2 days if symptoms fail to improve/resolve. Also follow up with primary care doctor regarding your blood pressure that is high today.   Return to ER if worse, new symptoms, high fevers, new or worsening or severe abdominal pain, persistent vomiting, chest pain, trouble breathing, or other concern.    Note that increasingly we are seeing a recurrent abdominal pain and/or vomiting syndrome called Cannabinoid Hyperemesis Syndrome - see attached info - in these cases, avoiding marijuana use will prevent symptoms from recurring (note that symptoms can persist for a few weeks if history of heavy marijuana use as it can take time to get out of system).

## 2023-12-23 ENCOUNTER — Ambulatory Visit: Payer: PRIVATE HEALTH INSURANCE | Admitting: Adult Health

## 2023-12-23 ENCOUNTER — Encounter: Payer: Self-pay | Admitting: Adult Health

## 2023-12-23 ENCOUNTER — Ambulatory Visit: Payer: PRIVATE HEALTH INSURANCE

## 2023-12-23 VITALS — BP 120/80 | HR 64 | Temp 98.3°F | Ht 67.0 in | Wt 185.0 lb

## 2023-12-23 DIAGNOSIS — Z1211 Encounter for screening for malignant neoplasm of colon: Secondary | ICD-10-CM

## 2023-12-23 DIAGNOSIS — M25551 Pain in right hip: Secondary | ICD-10-CM | POA: Diagnosis not present

## 2023-12-23 DIAGNOSIS — M5416 Radiculopathy, lumbar region: Secondary | ICD-10-CM

## 2023-12-23 MED ORDER — PREDNISONE 50 MG PO TABS
50.0000 mg | ORAL_TABLET | Freq: Every day | ORAL | 0 refills | Status: DC
Start: 2023-12-23 — End: 2024-06-22

## 2023-12-23 MED ORDER — NAPROXEN 500 MG PO TABS: 500.0000 mg | ORAL_TABLET | Freq: Two times a day (BID) | ORAL | 1 refills | Status: DC

## 2023-12-23 NOTE — Progress Notes (Signed)
 Subjective:    Patient ID: Marc Hancock, male    DOB: 21-Dec-1970, 53 y.o.   MRN: [id number]  Leg Pain    53 year old male who  has a past medical history of Allergy, GERD (gastroesophageal reflux disease), and Hypertension.  Discussed the use of AI scribe software for clinical note transcription with the patient, who gave verbal consent to proceed.  History of Present Illness   The patient, with a history of right-sided hip pain and was seen about a year ago for this issue. Xray of the right hip at this time was negative. Today he reports worsening symptoms over the past couple of months. The pain, initially intermittent, has been constant for the past couple of weeks. The pain is located in the right lower back, right hip and groin area, and radiates throughout the entire hip He will get numbness and tingling that radiates down the inside of his right leg to his foot.  The pain is exacerbated by walking, lifting, and work duties, and is less severe but still present at rest. The patient has been taking ibuprofen, which provides some relief but does not completely resolve the pain.        Review of Systems See HPI   Past Medical History:  Diagnosis Date   Allergy    GERD (gastroesophageal reflux disease)    Hypertension     Social History   Socioeconomic History   Marital status: Married    Spouse name: Not on file   Number of children: Not on file   Years of education: Not on file   Highest education level: Not on file  Occupational History   Not on file  Tobacco Use   Smoking status: Never    Passive exposure: Yes   Smokeless tobacco: Never  Vaping Use   Vaping status: Never Used  Substance and Sexual Activity   Alcohol use: Yes    Comment: occasionally   Drug use: Yes    Types: Marijuana    Comment: marijuana 2 x daily   Sexual activity: Yes    Partners: Female  Other Topics Concern   Not on file  Social History Narrative   He works at [employer]     He is married    Has 6 children - Age 18 to 6    Social Drivers of Corporate investment banker Strain: Not on file  Food Insecurity: Not on file  Transportation Needs: Not on file  Physical Activity: Not on file  Stress: Not on file  Social Connections: Not on file  Intimate Partner Violence: Not on file    Past Surgical History:  Procedure Laterality Date   HYDROCELE EXCISION Right 04/11/2013   Procedure: RIGHT HYDROCELECTOMY ADULT;  Surgeon: Jinny Mounts, MD;  Location: [facility];  Service: Urology;  Laterality: Right;   INGUINAL HERNIA REPAIR Right 04/11/2013   Procedure: HERNIA REPAIR INGUINAL ADULT;  Surgeon: Levert Ready, MD;  Location: [facility];  Service: General;  Laterality: Right;   VASECTOMY Bilateral 04/11/2013   Procedure: BILATERAL VASECTOMY;  Surgeon: Jinny Mounts, MD;  Location: [facility];  Service: Urology;  Laterality: Bilateral;    Family History  Problem Relation Age of Onset   Alcohol abuse Other    Arthritis Other    Heart disease Other    Stroke Other    Depression Other    Diabetes Other     No Known Allergies  Current Outpatient Medications on File Prior  to Visit  Medication Sig Dispense Refill   amLODipine  (NORVASC ) 5 MG tablet TAKE 2 TABLETS BY MOUTH EVERY DAY 180 tablet 0   aspirin  81 MG EC tablet TAKE 1 TABLET BY MOUTH EVERY DAY IN THE EVENING 60 tablet 0   losartan  (COZAAR ) 100 MG tablet TAKE 1 TABLET BY MOUTH EVERY DAY 90 tablet 0   methylPREDNISolone  (MEDROL  DOSEPAK) 4 MG TBPK tablet Take as directed 21 tablet 0   omeprazole  (PRILOSEC) 20 MG capsule Take 20 mg by mouth daily.     ondansetron  (ZOFRAN -ODT) 8 MG disintegrating tablet Take 1 tablet (8 mg total) by mouth every 8 (eight) hours as needed for nausea or vomiting. 10 tablet 0   pravastatin  (PRAVACHOL ) 20 MG tablet TAKE 1 TABLET BY MOUTH EVERY DAY 90 tablet 3   No current facility-administered medications on file prior to visit.    BP 120/80   Pulse 64   Temp 98.3 F (36.8 C)  (Oral)   Ht 5\' 7"  (1.702 m)   Wt 185 lb (83.9 kg)   SpO2 96%   BMI 28.98 kg/m       Objective:   Physical Exam Vitals and nursing note reviewed.  Constitutional:      Appearance: Normal appearance.  Cardiovascular:     Rate and Rhythm: Normal rate and regular rhythm.     Pulses: Normal pulses.     Heart sounds: Normal heart sounds.  Pulmonary:     Effort: Pulmonary effort is normal.     Breath sounds: Normal breath sounds.  Musculoskeletal:     Lumbar back: No swelling, tenderness or bony tenderness. Normal range of motion. Negative right straight leg raise test and negative left straight leg raise test.     Right upper leg: Normal.     Right knee: Normal.     Right lower leg: Normal.     Right ankle: Normal.     Right Achilles Tendon: Normal.     Comments: He has no pain with palpation but feels the pain with his daily activities   Skin:    General: Skin is warm and dry.     Capillary Refill: Capillary refill takes less than 2 seconds.  Neurological:     General: No focal deficit present.     Mental Status: He is alert and oriented to person, place, and time.  Psychiatric:        Mood and Affect: Mood normal.        Behavior: Behavior normal.        Thought Content: Thought content normal.        Judgment: Judgment normal.       Assessment & Plan:  1. Lumbar radiculopathy (Primary) - Will start with lumbar spine xray but likely need MRI of lumbar spine - DG Lumbar Spine Complete; Future - predniSONE  (DELTASONE ) 50 MG tablet; Take 1 tablet (50 mg total) by mouth daily with breakfast. Take one table daily  Dispense: 5 tablet; Refill: 0  2. Right hip pain - Possibly coming from back but due to constant nature over the years and negative xray will need to check soft tissue of hip. Will order MRI of right hip when I order MRI of his lumbar spine.  - naproxen  (NAPROSYN ) 500 MG tablet; Take 1 tablet (500 mg total) by mouth 2 (two) times daily with a meal.  Dispense: 30  tablet; Refill: 1  3. Colon cancer screening  - Cologuard  Alto Atta, NP

## 2023-12-30 ENCOUNTER — Other Ambulatory Visit: Payer: Self-pay | Admitting: Adult Health

## 2023-12-30 NOTE — Progress Notes (Unsigned)
   Subjective:    Patient ID: Marc Hancock, male    DOB: 1971/05/23, 53 y.o.   MRN: [id number]  HPI    Review of Systems     Objective:   Physical Exam        Assessment & Plan:

## 2023-12-31 ENCOUNTER — Telehealth: Payer: Self-pay

## 2023-12-31 ENCOUNTER — Other Ambulatory Visit: Payer: Self-pay | Admitting: Adult Health

## 2023-12-31 DIAGNOSIS — M5416 Radiculopathy, lumbar region: Secondary | ICD-10-CM

## 2023-12-31 DIAGNOSIS — M25551 Pain in right hip: Secondary | ICD-10-CM

## 2023-12-31 NOTE — Telephone Encounter (Signed)
 Copied from CRM [id number]. Topic: General - Other >> Dec 31, 2023  9:22 AM Dewanda Foots wrote: Reason for CRM: Pt is returning phone call to [facility] for Xray results on hip.  Please call him back at [phone] at earliest convenience. Thank you

## 2023-12-31 NOTE — Telephone Encounter (Signed)
 Noted.

## 2023-12-31 NOTE — Telephone Encounter (Signed)
This has been taking care of.

## 2023-12-31 NOTE — Telephone Encounter (Signed)
 Copied from CRM [id number]. Topic: General - Other >> Dec 31, 2023  9:37 AM Alysia Jumbo S wrote: Reason for CRM: Patient returning missed phone call for Xray results.

## 2024-01-28 ENCOUNTER — Encounter: Payer: Self-pay | Admitting: Adult Health

## 2024-02-12 ENCOUNTER — Other Ambulatory Visit: Payer: Self-pay | Admitting: Adult Health

## 2024-02-12 DIAGNOSIS — I10 Essential (primary) hypertension: Secondary | ICD-10-CM

## 2024-05-03 ENCOUNTER — Other Ambulatory Visit: Payer: Self-pay | Admitting: Adult Health

## 2024-05-03 DIAGNOSIS — I10 Essential (primary) hypertension: Secondary | ICD-10-CM

## 2024-05-10 NOTE — Telephone Encounter (Unsigned)
 Copied from CRM [id number]. Topic: Clinical - Lab/Test Results >> May 05, 2024  9:26 AM Graeme ORN wrote: Reason for CRM: Cassie with [employer] called. Has received Cologuard, missing Date and Time of sample. Unable to reach patient  at number provided. Would like to see if office an contact patient. Patient can give a call back [phone] select patient support.

## 2024-05-10 NOTE — Telephone Encounter (Signed)
 Pt stated that he was able to provide them with time and date of sample. No further actions needed!

## 2024-05-12 LAB — COLOGUARD: COLOGUARD: NEGATIVE

## 2024-05-16 ENCOUNTER — Ambulatory Visit: Payer: Self-pay | Admitting: Adult Health

## 2024-06-22 ENCOUNTER — Encounter: Payer: Self-pay | Admitting: Adult Health

## 2024-06-22 ENCOUNTER — Ambulatory Visit: Payer: PRIVATE HEALTH INSURANCE | Admitting: Adult Health

## 2024-06-22 ENCOUNTER — Other Ambulatory Visit: Payer: Self-pay | Admitting: Adult Health

## 2024-06-22 VITALS — BP 132/80 | HR 78 | Temp 98.3°F | Ht 67.0 in | Wt 186.0 lb

## 2024-06-22 DIAGNOSIS — G5603 Carpal tunnel syndrome, bilateral upper limbs: Secondary | ICD-10-CM

## 2024-06-22 MED ORDER — PREDNISONE 10 MG PO TABS: ORAL_TABLET | ORAL | 0 refills | Status: DC

## 2024-06-22 NOTE — Progress Notes (Signed)
 Subjective:    Patient ID: Marc Hancock, male    DOB: [birth year], 53 y.o.   MRN: [id number]  HPI Discussed the use of AI scribe software for clinical note transcription with the patient, who gave verbal consent to proceed.  History of Present Illness   Marc Hancock is a 53 year old male who presents with numbness and tingling in the fingers of both hands.  He experiences numbness and tingling in the first three fingers of both hands that is constant.  This has been more prominent in the last month and persists despite changes in activities. The sensation is constant and unaffected by arm position or movement. It does not disturb his sleep, allowing him to rest for four to five hours before waking around 5 AM. He frequently uses his hands at work.    Xray of cervical spine from 2023 showed IMPRESSION: No recent fracture is seen in the cervical spine. Cervical spondylosis with encroachment of neural foramina from C3-C7 levels.        Review of Systems See HPI   Past Medical History:  Diagnosis Date   Allergy    GERD (gastroesophageal reflux disease)    Hypertension     Social History   Socioeconomic History   Marital status: Married    Spouse name: Not on file   Number of children: Not on file   Years of education: Not on file   Highest education level: Not on file  Occupational History   Not on file  Tobacco Use   Smoking status: Never    Passive exposure: Yes   Smokeless tobacco: Never  Vaping Use   Vaping status: Never Used  Substance and Sexual Activity   Alcohol use: Yes    Comment: occasionally   Drug use: Yes    Types: Marijuana    Comment: marijuana 2 x daily   Sexual activity: Yes    Partners: Female  Other Topics Concern   Not on file  Social History Narrative   He works at [employer]    He is married    Has 6 children - Age 40 to 6    Social Drivers of Corporate investment banker Strain: Not on file  Food Insecurity: Not on file   Transportation Needs: Not on file  Physical Activity: Not on file  Stress: Not on file  Social Connections: Not on file  Intimate Partner Violence: Not on file    Past Surgical History:  Procedure Laterality Date   HYDROCELE EXCISION Right 04/11/2013   Procedure: RIGHT HYDROCELECTOMY ADULT;  Surgeon: Thomasine Oiler, MD;  Location: [facility];  Service: Urology;  Laterality: Right;   INGUINAL HERNIA REPAIR Right 04/11/2013   Procedure: HERNIA REPAIR INGUINAL ADULT;  Surgeon: Elon CHRISTELLA Pacini, MD;  Location: [facility];  Service: General;  Laterality: Right;   VASECTOMY Bilateral 04/11/2013   Procedure: BILATERAL VASECTOMY;  Surgeon: Thomasine Oiler, MD;  Location: [facility];  Service: Urology;  Laterality: Bilateral;    Family History  Problem Relation Age of Onset   Alcohol abuse Other    Arthritis Other    Heart disease Other    Stroke Other    Depression Other    Diabetes Other     No Known Allergies  Current Outpatient Medications on File Prior to Visit  Medication Sig Dispense Refill   amLODipine  (NORVASC ) 5 MG tablet TAKE 2 TABLETS BY MOUTH EVERY DAY 180 tablet 0   aspirin  81 MG EC tablet  TAKE 1 TABLET BY MOUTH EVERY DAY IN THE EVENING 60 tablet 0   losartan  (COZAAR ) 100 MG tablet TAKE 1 TABLET BY MOUTH EVERY DAY 90 tablet 0   methylPREDNISolone  (MEDROL  DOSEPAK) 4 MG TBPK tablet Take as directed 21 tablet 0   naproxen  (NAPROSYN ) 500 MG tablet Take 1 tablet (500 mg total) by mouth 2 (two) times daily with a meal. 30 tablet 1   omeprazole  (PRILOSEC) 20 MG capsule Take 20 mg by mouth daily.     ondansetron  (ZOFRAN -ODT) 8 MG disintegrating tablet Take 1 tablet (8 mg total) by mouth every 8 (eight) hours as needed for nausea or vomiting. 10 tablet 0   pravastatin  (PRAVACHOL ) 20 MG tablet TAKE 1 TABLET BY MOUTH EVERY DAY 90 tablet 3   No current facility-administered medications on file prior to visit.    BP 132/80   Pulse 78   Temp 98.3 F (36.8 C) (Oral)   Ht 5' 7 (1.702 m)    Wt 186 lb (84.4 kg)   SpO2 97%   BMI 29.13 kg/m      Objective:   Physical Exam Vitals and nursing note reviewed.  Constitutional:      Appearance: Normal appearance.  Musculoskeletal:        General: Normal range of motion.  Skin:    General: Skin is warm and dry.     Capillary Refill: Capillary refill takes less than 2 seconds.  Neurological:     General: No focal deficit present.     Mental Status: He is alert and oriented to person, place, and time.     Comments: + Phalens test, negative Tinel's sign  No decreased grip strength  Psychiatric:        Mood and Affect: Mood normal.        Behavior: Behavior normal.        Thought Content: Thought content normal.        Judgment: Judgment normal.        Assessment & Plan:  1. Bilateral carpal tunnel syndrome (Primary) - Symptoms consistent with Carpal tunnel. Will hold off on imaging of cervical spine for the time being. Will have him get some splints off Amazaon and treat with steroids.  - Follow up if not resolved in the next 2 weeks  - predniSONE  (DELTASONE ) 10 MG tablet; Take two tablets ( 20 mg) daily for 7 days and then 1 tablet ( 10mg ) daily for 7 days  Dispense: 5 tablet; Refill: 0  Darleene Shape, NP

## 2024-06-26 ENCOUNTER — Telehealth: Payer: Self-pay | Admitting: Adult Health

## 2024-06-26 NOTE — Telephone Encounter (Signed)
 Copied from CRM [id number]. Topic: Clinical - Prescription Issue >> Jun 26, 2024  4:50 PM Nessti S wrote: Reason for CRM: ben called because pt prescription predniSONE  (DELTASONE ) 10 MG tablet has incorrect quantity. It was refilled with 5 tablets. Call back number [phone]

## 2024-06-27 ENCOUNTER — Other Ambulatory Visit: Payer: Self-pay | Admitting: Adult Health

## 2024-06-27 ENCOUNTER — Telehealth: Payer: Self-pay

## 2024-06-27 DIAGNOSIS — G5603 Carpal tunnel syndrome, bilateral upper limbs: Secondary | ICD-10-CM

## 2024-06-27 MED ORDER — PREDNISONE 10 MG PO TABS
ORAL_TABLET | ORAL | 0 refills | Status: DC
Start: 2024-06-27 — End: 2024-07-20

## 2024-06-27 NOTE — Telephone Encounter (Unsigned)
 Copied from CRM [id number]. Topic: Clinical - Prescription Issue >> Jun 22, 2024  5:33 PM Dedra B wrote: Reason for CRM: Pt was told by pharmacy that there was an issue with his prednisone  prescription that was sent today. Pt does not know what the issue is. Pls call pt and/or pharmacy.

## 2024-06-27 NOTE — Telephone Encounter (Signed)
 Patient notified of update  and verbalized understanding.

## 2024-06-27 NOTE — Telephone Encounter (Unsigned)
 Copied from CRM [id number]. Topic: Clinical - Prescription Issue >> Jun 26, 2024  1:34 PM Shereese L wrote: Reason for CRM: Patient called in and stated that the pharmacy stated that the script for predniSONE  (DELTASONE ) 10 MG tablet is only 5 pills and needs to be resent to the pharmacy to cover the full 2 weeks that meds are suppose to be taken

## 2024-07-20 ENCOUNTER — Ambulatory Visit: Payer: PRIVATE HEALTH INSURANCE | Admitting: Adult Health

## 2024-07-20 ENCOUNTER — Encounter: Payer: Self-pay | Admitting: Adult Health

## 2024-07-20 ENCOUNTER — Ambulatory Visit: Payer: Self-pay | Admitting: Adult Health

## 2024-07-20 ENCOUNTER — Other Ambulatory Visit: Payer: Self-pay | Admitting: Adult Health

## 2024-07-20 VITALS — BP 110/80 | HR 89 | Temp 98.5°F | Ht 66.5 in

## 2024-07-20 DIAGNOSIS — I10 Essential (primary) hypertension: Secondary | ICD-10-CM

## 2024-07-20 DIAGNOSIS — E559 Vitamin D deficiency, unspecified: Secondary | ICD-10-CM | POA: Diagnosis not present

## 2024-07-20 DIAGNOSIS — E538 Deficiency of other specified B group vitamins: Secondary | ICD-10-CM

## 2024-07-20 DIAGNOSIS — I2510 Atherosclerotic heart disease of native coronary artery without angina pectoris: Secondary | ICD-10-CM

## 2024-07-20 DIAGNOSIS — Z125 Encounter for screening for malignant neoplasm of prostate: Secondary | ICD-10-CM | POA: Diagnosis not present

## 2024-07-20 DIAGNOSIS — E782 Mixed hyperlipidemia: Secondary | ICD-10-CM

## 2024-07-20 DIAGNOSIS — K219 Gastro-esophageal reflux disease without esophagitis: Secondary | ICD-10-CM | POA: Diagnosis not present

## 2024-07-20 DIAGNOSIS — Z0000 Encounter for general adult medical examination without abnormal findings: Secondary | ICD-10-CM | POA: Diagnosis not present

## 2024-07-20 LAB — CBC
HCT: 38.4 % — ABNORMAL LOW (ref 39.0–52.0)
Hemoglobin: 12.7 g/dL — ABNORMAL LOW (ref 13.0–17.0)
MCHC: 33.1 g/dL (ref 30.0–36.0)
MCV: 91.5 fl (ref 78.0–100.0)
Platelets: 217 K/uL (ref 150.0–400.0)
RBC: 4.2 Mil/uL — ABNORMAL LOW (ref 4.22–5.81)
RDW: 14.2 % (ref 11.5–15.5)
WBC: 7.7 K/uL (ref 4.0–10.5)

## 2024-07-20 LAB — LIPID PANEL
Cholesterol: 208 mg/dL — ABNORMAL HIGH (ref 0–200)
HDL: 54.9 mg/dL (ref >39.00)
LDL Cholesterol: 135 mg/dL — ABNORMAL HIGH (ref 0–99)
NonHDL: 153.58
Total CHOL/HDL Ratio: 4
Triglycerides: 91 mg/dL (ref 0.0–149.0)
VLDL: 18.2 mg/dL (ref 0.0–40.0)

## 2024-07-20 LAB — COMPREHENSIVE METABOLIC PANEL WITH GFR
ALT: 33 U/L (ref 0–53)
AST: 28 U/L (ref 0–37)
Albumin: 4 g/dL (ref 3.5–5.2)
Alkaline Phosphatase: 78 U/L (ref 39–117)
BUN: 21 mg/dL (ref 6–23)
CO2: 27 meq/L (ref 19–32)
Calcium: 9.3 mg/dL (ref 8.4–10.5)
Chloride: 106 meq/L (ref 96–112)
Creatinine, Ser: 1.42 mg/dL (ref 0.40–1.50)
GFR: 55.76 mL/min — ABNORMAL LOW (ref >60.00)
Glucose, Bld: 94 mg/dL (ref 70–99)
Potassium: 4.4 meq/L (ref 3.5–5.1)
Sodium: 140 meq/L (ref 135–145)
Total Bilirubin: 1.2 mg/dL (ref 0.2–1.2)
Total Protein: 6.7 g/dL (ref 6.0–8.3)

## 2024-07-20 LAB — PSA: PSA: 1.69 ng/mL (ref 0.10–4.00)

## 2024-07-20 LAB — VITAMIN B12: Vitamin B-12: 89 pg/mL — ABNORMAL LOW (ref 211–911)

## 2024-07-20 LAB — VITAMIN D 25 HYDROXY (VIT D DEFICIENCY, FRACTURES): VITD: 8.05 ng/mL — ABNORMAL LOW (ref 30.00–100.00)

## 2024-07-20 MED ORDER — OMEPRAZOLE 20 MG PO CPDR: 20.0000 mg | DELAYED_RELEASE_CAPSULE | Freq: Every day | ORAL | 3 refills | Status: AC

## 2024-07-20 MED ORDER — VITAMIN D (ERGOCALCIFEROL) 1.25 MG (50000 UNIT) PO CAPS: 50000.0000 [IU] | ORAL_CAPSULE | ORAL | 1 refills | Status: AC

## 2024-07-20 MED ORDER — ROSUVASTATIN CALCIUM 10 MG PO TABS: 10.0000 mg | ORAL_TABLET | Freq: Every day | ORAL | 3 refills | Status: AC

## 2024-07-20 MED ORDER — LOSARTAN POTASSIUM 100 MG PO TABS: 100.0000 mg | ORAL_TABLET | Freq: Every day | ORAL | 3 refills | Status: AC

## 2024-07-20 MED ORDER — AMLODIPINE BESYLATE 10 MG PO TABS: 10.0000 mg | ORAL_TABLET | Freq: Every day | ORAL | 3 refills | Status: AC

## 2024-07-20 NOTE — Progress Notes (Signed)
 Subjective:    Patient ID: Marc Hancock, male    DOB: Jun 20, 1971, 53 y.o.   MRN: [id number]  HPI Patient presents for yearly preventative medicine examination. He is a pleasant 53 year old male who  has a past medical history of Allergy, GERD (gastroesophageal reflux disease), and Hypertension.  Essential Hypertension -well controlled with Norvasc  10 mg and losartan  100 mg daily.  He does monitor his blood pressures at home periodically and reports that most of his readings are in the 120s over 80 range.  He denies dizziness, lightheadedness, chest pain, or shortness of breath BP Readings from Last 3 Encounters:  07/20/24 110/80  06/22/24 132/80  12/23/23 120/80   Hyperlipidemia/CAD  Coronary CTA on 11/17/2019 showed nonobsructive CAD ( less than 25% stenosisi in mid LAD), calcium score 0. -currently prescribed pravastatin  20 mg daily.  He denies myalgia or fatigue Lab Results  Component Value Date   CHOL 200 (H) 12/14/2022   HDL 46 12/14/2022   LDLCALC 132 (H) 12/14/2022   TRIG 120 12/14/2022   CHOLHDL 4.3 12/14/2022   GERD -reports symptoms are well controlled with omeprazole  20 mg daily   Vitamin D  Deficiency  Last vitamin D  Lab Results  Component Value Date   VD25OH 11.23 (L) 09/03/2022   Vitamin B12 Deficiency  Lab Results  Component Value Date   VITAMINB12 109 (L) 09/03/2022    All immunizations and health maintenance protocols were reviewed with the patient and needed orders were placed.  Appropriate screening laboratory values were ordered for the patient including screening of hyperlipidemia, renal function and hepatic function. If indicated by BPH, a PSA was ordered.  Medication reconciliation,  past medical history, social history, problem list and allergies were reviewed in detail with the patient  Goals were established with regard to weight loss, exercise, and  diet in compliance with medications. He has not been following a specific diet nor exercising.    Wt Readings from Last 3 Encounters:  06/22/24 186 lb (84.4 kg)  12/23/23 185 lb (83.9 kg)  11/05/23 172 lb (78 kg)   He is up to date on colon cancer screening    Review of Systems  Constitutional: Negative.   HENT: Negative.    Eyes: Negative.   Respiratory: Negative.    Cardiovascular: Negative.   Gastrointestinal: Negative.   Endocrine: Negative.   Genitourinary: Negative.   Musculoskeletal: Negative.   Skin: Negative.   Allergic/Immunologic: Negative.   Neurological: Negative.   Hematological: Negative.   Psychiatric/Behavioral: Negative.    All other systems reviewed and are negative.  Past Medical History:  Diagnosis Date   Allergy    GERD (gastroesophageal reflux disease)    Hypertension     Social History   Socioeconomic History   Marital status: Married    Spouse name: Not on file   Number of children: Not on file   Years of education: Not on file   Highest education level: Not on file  Occupational History   Not on file  Tobacco Use   Smoking status: Never    Passive exposure: Yes   Smokeless tobacco: Never  Vaping Use   Vaping status: Never Used  Substance and Sexual Activity   Alcohol use: Yes    Comment: occasionally   Drug use: Yes    Types: Marijuana    Comment: marijuana 2 x daily   Sexual activity: Yes    Partners: Female  Other Topics Concern   Not on file  Social History Narrative  He works at [employer]    He is married    Has 6 children - Age 85 to 51    Social Drivers of Corporate Investment Banker Strain: Not on file  Food Insecurity: Not on file  Transportation Needs: Not on file  Physical Activity: Not on file  Stress: Not on file  Social Connections: Not on file  Intimate Partner Violence: Not on file    Past Surgical History:  Procedure Laterality Date   HYDROCELE EXCISION Right 04/11/2013   Procedure: RIGHT HYDROCELECTOMY ADULT;  Surgeon: Thomasine Oiler, MD;  Location: [facility];  Service: Urology;   Laterality: Right;   INGUINAL HERNIA REPAIR Right 04/11/2013   Procedure: HERNIA REPAIR INGUINAL ADULT;  Surgeon: Elon CHRISTELLA Pacini, MD;  Location: [facility];  Service: General;  Laterality: Right;   VASECTOMY Bilateral 04/11/2013   Procedure: BILATERAL VASECTOMY;  Surgeon: Thomasine Oiler, MD;  Location: [facility];  Service: Urology;  Laterality: Bilateral;    Family History  Problem Relation Age of Onset   Alcohol abuse Other    Arthritis Other    Heart disease Other    Stroke Other    Depression Other    Diabetes Other     No Known Allergies  Current Outpatient Medications on File Prior to Visit  Medication Sig Dispense Refill   amLODipine  (NORVASC ) 5 MG tablet TAKE 2 TABLETS BY MOUTH EVERY DAY 180 tablet 0   aspirin  81 MG EC tablet TAKE 1 TABLET BY MOUTH EVERY DAY IN THE EVENING 60 tablet 0   losartan  (COZAAR ) 100 MG tablet TAKE 1 TABLET BY MOUTH EVERY DAY 90 tablet 0   Multiple Vitamins-Minerals (MULTIVITAMIN WITH MINERALS) tablet Take 1 tablet by mouth daily.     omeprazole  (PRILOSEC) 20 MG capsule Take 20 mg by mouth daily.     pravastatin  (PRAVACHOL ) 20 MG tablet TAKE 1 TABLET BY MOUTH EVERY DAY 90 tablet 3   No current facility-administered medications on file prior to visit.    BP 110/80   Pulse 89   Temp 98.5 F (36.9 C) (Oral)   Ht 5' 6.5 (1.689 m)   SpO2 97%   BMI 29.57 kg/m       Objective:   Physical Exam Vitals and nursing note reviewed.  Constitutional:      General: He is not in acute distress.    Appearance: Normal appearance. He is not ill-appearing.  HENT:     Head: Normocephalic and atraumatic.     Right Ear: Tympanic membrane, ear canal and external ear normal. There is no impacted cerumen.     Left Ear: Tympanic membrane, ear canal and external ear normal. There is no impacted cerumen.     Nose: Nose normal. No congestion or rhinorrhea.     Mouth/Throat:     Mouth: Mucous membranes are moist.     Pharynx: Oropharynx is clear.  Eyes:      Extraocular Movements: Extraocular movements intact.     Conjunctiva/sclera: Conjunctivae normal.     Pupils: Pupils are equal, round, and reactive to light.  Neck:     Vascular: No carotid bruit.  Cardiovascular:     Rate and Rhythm: Normal rate and regular rhythm.     Pulses: Normal pulses.     Heart sounds: No murmur heard.    No friction rub. No gallop.  Pulmonary:     Effort: Pulmonary effort is normal.     Breath sounds: Normal breath sounds.  Abdominal:  General: Abdomen is flat. Bowel sounds are normal. There is no distension.     Palpations: Abdomen is soft. There is no mass.     Tenderness: There is no abdominal tenderness. There is no guarding or rebound.     Hernia: No hernia is present.  Musculoskeletal:        General: Normal range of motion.     Cervical back: Normal range of motion and neck supple.  Lymphadenopathy:     Cervical: No cervical adenopathy.  Skin:    General: Skin is warm and dry.     Capillary Refill: Capillary refill takes less than 2 seconds.  Neurological:     General: No focal deficit present.     Mental Status: He is alert and oriented to person, place, and time.  Psychiatric:        Mood and Affect: Mood normal.        Behavior: Behavior normal.        Thought Content: Thought content normal.        Judgment: Judgment normal.        Assessment & Plan:  1. Routine general medical examination at a health care facility (Primary) Today patient counseled on age appropriate routine health concerns for screening and prevention, each reviewed and up to date or declined. Immunizations reviewed and up to date or declined. Labs ordered and reviewed. Risk factors for depression reviewed and negative. Hearing function and visual acuity are intact. ADLs screened and addressed as needed. Functional ability and level of safety reviewed and appropriate. Education, counseling and referrals performed based on assessed risks today. Patient provided with a  copy of personalized plan for preventive services. - Refused all vaccinations today  - Encouraged to start working out and eating healthy  - Follow up I one year or sooner if needed  2. Essential hypertension - Controlled. No change in medications  - Lipid panel; Future - CBC; Future - Comprehensive metabolic panel with GFR; Future - amLODipine  (NORVASC ) 10 MG tablet; Take 1 tablet (10 mg total) by mouth daily.  Dispense: 90 tablet; Refill: 3 - losartan  (COZAAR ) 100 MG tablet; Take 1 tablet (100 mg total) by mouth daily.  Dispense: 90 tablet; Refill: 3  3. Mixed hyperlipidemia  - Lipid panel; Future - CBC; Future - Comprehensive metabolic panel with GFR; Future  4. Coronary artery disease involving native coronary artery of native heart without angina pectoris - Likely change statin to Crestor  - Lipid panel; Future - CBC; Future - Comprehensive metabolic panel with GFR; Future  5. Gastroesophageal reflux disease without esophagitis - Controlled.  - Lipid panel; Future - CBC; Future - Comprehensive metabolic panel with GFR; Future - omeprazole  (PRILOSEC) 20 MG capsule; Take 1 capsule (20 mg total) by mouth daily.  Dispense: 90 capsule; Refill: 3  6. Prostate cancer screening  - PSA; Future  7. Vitamin D  deficiency  - VITAMIN D  25 Hydroxy (Vit-D Deficiency, Fractures); Future  8. Vitamin B12 deficiency  - Vitamin B12; Future  Larah Kuntzman, NP

## 2024-08-03 ENCOUNTER — Other Ambulatory Visit: Payer: Self-pay | Admitting: Adult Health

## 2024-08-03 DIAGNOSIS — I10 Essential (primary) hypertension: Secondary | ICD-10-CM

## 2024-08-03 NOTE — Telephone Encounter (Signed)
 The original prescription was discontinued on 07/20/2024 by Merna Huxley, NP. Renewing this prescription may not be appropriate.

## 2024-09-04 ENCOUNTER — Ambulatory Visit: Payer: Self-pay

## 2024-09-04 NOTE — Telephone Encounter (Signed)
 FYI Only or Action Required?: FYI only for provider: appointment scheduled on 1/7.  Patient was last seen in primary care on 07/20/2024 by Merna Huxley, NP.  Called Nurse Triage reporting Numbness.  Symptoms began several months ago.  Interventions attempted: Nothing.  Symptoms are: gradually worsening.  Triage Disposition: See PCP When Office is Open (Within 3 Days)  Patient/caregiver understands and will follow disposition?: Yes, will follow disposition  Copied from CRM [id number]. Topic: Clinical - Red Word Triage >> Sep 04, 2024  8:19 AM Carlyon D wrote: Red Word that prompted transfer to Nurse Triage: Left and right hand numbness now going up the arm on the left side.  Right foot ankle pt feels heat coming from it he didn't hurt it or anything so pt is concerned. Reason for Disposition  [1] Weakness of arm / hand, or leg / foot AND [2] is a chronic symptom (recurrent or ongoing AND present > 4 weeks)  Answer Assessment - Initial Assessment Questions 1. SYMPTOM: What is the main symptom you are concerned about? (e.g., weakness, numbness)     Numbness worsening, states has seen pcp 2. ONSET: When did this start? (e.g., minutes, hours, days; while sleeping)     Months ago 4. PATTERN Does this come and go, or has it been constant since it started?  Is it present now?     constant 5. CARDIAC SYMPTOMS: Have you had any of the following symptoms: chest pain, difficulty breathing, palpitations?     denies 6. NEUROLOGIC SYMPTOMS: Have you had any of the following symptoms: headache, dizziness, vision loss, double vision, changes in speech, unsteady on your feet?     denies 7. OTHER SYMPTOMS: Do you have any other symptoms?     denies  Protocols used: Neurologic Deficit-A-AH

## 2024-09-06 ENCOUNTER — Encounter: Payer: Self-pay | Admitting: Adult Health

## 2024-09-06 ENCOUNTER — Other Ambulatory Visit: Payer: Self-pay | Admitting: Adult Health

## 2024-09-06 ENCOUNTER — Ambulatory Visit (INDEPENDENT_AMBULATORY_CARE_PROVIDER_SITE_OTHER): Payer: PRIVATE HEALTH INSURANCE

## 2024-09-06 ENCOUNTER — Ambulatory Visit (INDEPENDENT_AMBULATORY_CARE_PROVIDER_SITE_OTHER): Payer: PRIVATE HEALTH INSURANCE | Admitting: Adult Health

## 2024-09-06 VITALS — BP 138/88 | HR 74 | Temp 98.6°F | Ht 66.5 in | Wt 191.0 lb

## 2024-09-06 DIAGNOSIS — M5416 Radiculopathy, lumbar region: Secondary | ICD-10-CM | POA: Diagnosis not present

## 2024-09-06 DIAGNOSIS — M542 Cervicalgia: Secondary | ICD-10-CM

## 2024-09-06 MED ORDER — PREDNISONE 50 MG PO TABS: 50.0000 mg | ORAL_TABLET | Freq: Every day | ORAL | 0 refills | Status: AC

## 2024-09-06 MED ORDER — CYCLOBENZAPRINE HCL 10 MG PO TABS: 10.0000 mg | ORAL_TABLET | Freq: Every day | ORAL | 0 refills | Status: AC

## 2024-09-06 MED ORDER — GABAPENTIN 300 MG PO CAPS: 300.0000 mg | ORAL_CAPSULE | Freq: Three times a day (TID) | ORAL | 3 refills | Status: AC

## 2024-09-06 NOTE — Progress Notes (Signed)
 "  Subjective:    Patient ID: Marc Hancock, male    DOB: 1971/03/13, 54 y.o.   MRN: [id number]  HPI Discussed the use of AI scribe software for clinical note transcription with the patient, who gave verbal consent to proceed.  History of Present Illness   Marc Hancock is a 55 year old male who presents with severe pain in his hands and right leg.  He has severe stiffness, coldness, and brittle pain in his hands, mainly in the first three fingers on the left and first two on the right, with radiation up the left forearm. Symptoms worsen with touching metal or cold objects and cause significant difficulty gripping at work.  He has persistent right leg pain since April involving the right lower back, hip, and groin, radiating down the inside of the right leg to the foot, with numbness and tingling. Pain worsens with walking, lifting, and work duties and improves at rest, and groin pain has improved. Ibuprofen helped partially in the past. He had a spine x-ray in April.which showed degenerative changes and MRI was ordered at this time but was not completed.   He denies right leg swelling.        Review of Systems See HPI   Past Medical History:  Diagnosis Date   Allergy    GERD (gastroesophageal reflux disease)    Hypertension     Social History   Socioeconomic History   Marital status: Married    Spouse name: Not on file   Number of children: Not on file   Years of education: Not on file   Highest education level: Not on file  Occupational History   Not on file  Tobacco Use   Smoking status: Never    Passive exposure: Yes   Smokeless tobacco: Never  Vaping Use   Vaping status: Never Used  Substance and Sexual Activity   Alcohol use: Yes    Comment: occasionally   Drug use: Yes    Types: Marijuana    Comment: marijuana 2 x daily   Sexual activity: Yes    Partners: Female  Other Topics Concern   Not on file  Social History Narrative   He works at [employer]     He is married    Has 6 children - Age 78 to 75    Social Drivers of Health   Tobacco Use: Medium Risk (09/06/2024)   Patient History    Smoking Tobacco Use: Never    Smokeless Tobacco Use: Never    Passive Exposure: Yes  Financial Resource Strain: Not on file  Food Insecurity: Not on file  Transportation Needs: Not on file  Physical Activity: Not on file  Stress: Not on file  Social Connections: Not on file  Intimate Partner Violence: Not on file  Depression (PHQ2-9): Low Risk (06/22/2024)   Depression (PHQ2-9)    PHQ-2 Score: 0  Alcohol Screen: Not on file  Housing: Not on file  Utilities: Not on file  Health Literacy: Not on file    Past Surgical History:  Procedure Laterality Date   HYDROCELE EXCISION Right 04/11/2013   Procedure: RIGHT HYDROCELECTOMY ADULT;  Surgeon: Thomasine Oiler, MD;  Location: [facility];  Service: Urology;  Laterality: Right;   INGUINAL HERNIA REPAIR Right 04/11/2013   Procedure: HERNIA REPAIR INGUINAL ADULT;  Surgeon: Elon CHRISTELLA Pacini, MD;  Location: [facility];  Service: General;  Laterality: Right;   VASECTOMY Bilateral 04/11/2013   Procedure: BILATERAL VASECTOMY;  Surgeon: Thomasine Oiler,  MD;  Location: [facility];  Service: Urology;  Laterality: Bilateral;    Family History  Problem Relation Age of Onset   Alcohol abuse Other    Arthritis Other    Heart disease Other    Stroke Other    Depression Other    Diabetes Other     Allergies[1]  Medications Ordered Prior to Encounter[2]  BP 138/88   Pulse 74   Temp 98.6 F (37 C) (Oral)   Ht 5' 6.5 (1.689 m)   Wt 191 lb (86.6 kg)   SpO2 97%   BMI 30.37 kg/m       Objective:   Physical Exam Musculoskeletal:        General: Tenderness present.       Back:     Comments: + Spurlings test to left side. Pain with palpation to the superior aspect of left scapula.   Skin:    General: Skin is warm and dry.  Neurological:     General: No focal deficit present.     Mental Status: He is oriented to  person, place, and time. Mental status is at baseline.     Sensory: Sensation is intact.     Motor: Motor function is intact.     Coordination: Coordination is intact.     Comments: 3/4/5 grip strength bilaterally with obvious pain with grip strength  Negative Phalen and tinels sign   Psychiatric:        Mood and Affect: Mood normal.        Behavior: Behavior normal.        Thought Content: Thought content normal.        Judgment: Judgment normal.        Assessment & Plan:   Assessment and Plan    Cervical radiculopathy Pain, numbness, and tingling in hands and forearm. Suspected foraminal narrowing causing nerve compression. - Prescribed prednisone  50 mg for 5 days. - Prescribed gabapentin  for nerve pain, up to three times daily, starting at night. - Prescribed muscle relaxer for nighttime use. - Once xray is back will likely need MRI and possible referral to orthopedics or neurosurgery.   Lumbar radiculopathy Chronic pain in right lower back, hip, and groin with radiating symptoms. Previous x-ray showed degenerative changes. Suspected similar spinal issues as cervical region. - Ordered MRI of lumbar spine concurrently with cervical spine MRI.      Jacyln Carmer, NP  I personally spent a total of 32 minutes in the care of the patient today including preparing to see the patient, getting/reviewing separately obtained history, performing a medically appropriate exam/evaluation, counseling and educating, placing orders, and documenting clinical information in the EHR.     [1] No Known Allergies [2]  Current Outpatient Medications on File Prior to Visit  Medication Sig Dispense Refill   amLODipine  (NORVASC ) 10 MG tablet Take 1 tablet (10 mg total) by mouth daily. 90 tablet 3   aspirin  81 MG EC tablet TAKE 1 TABLET BY MOUTH EVERY DAY IN THE EVENING 60 tablet 0   losartan  (COZAAR ) 100 MG tablet Take 1 tablet (100 mg total) by mouth daily. 90 tablet 3   Multiple  Vitamins-Minerals (MULTIVITAMIN WITH MINERALS) tablet Take 1 tablet by mouth daily.     omeprazole  (PRILOSEC) 20 MG capsule Take 1 capsule (20 mg total) by mouth daily. 90 capsule 3   rosuvastatin  (CRESTOR ) 10 MG tablet Take 1 tablet (10 mg total) by mouth daily. 90 tablet 3   Vitamin D , Ergocalciferol , (DRISDOL ) 1.25  MG (50000 UNIT) CAPS capsule Take 1 capsule (50,000 Units total) by mouth every 7 (seven) days. 12 capsule 1   No current facility-administered medications on file prior to visit.   "

## 2024-09-07 ENCOUNTER — Ambulatory Visit: Payer: Self-pay | Admitting: Adult Health

## 2024-09-07 DIAGNOSIS — M542 Cervicalgia: Secondary | ICD-10-CM

## 2024-09-07 DIAGNOSIS — M5416 Radiculopathy, lumbar region: Secondary | ICD-10-CM

## 2024-09-14 ENCOUNTER — Encounter: Payer: Self-pay | Admitting: Adult Health

## 2024-09-15 ENCOUNTER — Other Ambulatory Visit

## 2024-09-16 ENCOUNTER — Other Ambulatory Visit: Payer: Self-pay | Admitting: Adult Health

## 2024-09-16 DIAGNOSIS — M542 Cervicalgia: Secondary | ICD-10-CM

## 2024-09-16 DIAGNOSIS — M5416 Radiculopathy, lumbar region: Secondary | ICD-10-CM

## 2024-09-26 ENCOUNTER — Telehealth: Payer: Self-pay | Admitting: Adult Health

## 2024-09-26 ENCOUNTER — Encounter: Payer: Self-pay | Admitting: Adult Health

## 2024-09-26 NOTE — Telephone Encounter (Signed)
 Error already TE chart

## 2024-09-26 NOTE — Telephone Encounter (Signed)
 Copied from CRM #[id number]. Topic: General - Other >> Sep 26, 2024  3:30 PM Larissa RAMAN wrote: Reason for CRM: [facility] states patient needs a prior authorization for 09/28/24 appointment for cervical spine and lumbar spine.

## 2024-09-27 ENCOUNTER — Encounter: Payer: Self-pay | Admitting: Adult Health

## 2024-09-28 ENCOUNTER — Encounter: Payer: Self-pay | Admitting: Adult Health

## 2024-09-28 ENCOUNTER — Other Ambulatory Visit

## 2024-10-03 ENCOUNTER — Ambulatory Visit
Admission: RE | Admit: 2024-10-03 | Discharge: 2024-10-03 | Disposition: A | Source: Ambulatory Visit | Attending: Adult Health

## 2024-10-03 ENCOUNTER — Other Ambulatory Visit

## 2024-10-03 DIAGNOSIS — M542 Cervicalgia: Secondary | ICD-10-CM

## 2024-10-03 DIAGNOSIS — M5416 Radiculopathy, lumbar region: Secondary | ICD-10-CM

## 2024-10-05 ENCOUNTER — Ambulatory Visit: Payer: Self-pay | Admitting: Adult Health

## 2024-10-05 ENCOUNTER — Telehealth: Payer: Self-pay | Admitting: Adult Health

## 2024-10-05 DIAGNOSIS — M542 Cervicalgia: Secondary | ICD-10-CM

## 2024-10-05 DIAGNOSIS — G589 Mononeuropathy, unspecified: Secondary | ICD-10-CM

## 2024-10-05 NOTE — Telephone Encounter (Signed)
 Copied from CRM [id number]. Topic: General - Other >> Oct 05, 2024  3:39 PM Delon DASEN wrote: Reason for CRM: Patient wants to let Darleene know he had MRI done on Tuesday of this week 2/3

## 2024-10-06 ENCOUNTER — Other Ambulatory Visit

## 2024-10-06 NOTE — Telephone Encounter (Signed)
 Noted.
